# Patient Record
Sex: Female | Born: 1950 | Race: White | Hispanic: No | Smoking: Never smoker
Health system: Southern US, Community
[De-identification: ages and names within clinical notes are randomized; demographics above are authoritative.]

## PROBLEM LIST (undated history)

## (undated) DIAGNOSIS — Z8639 Personal history of other endocrine, nutritional and metabolic disease: Secondary | ICD-10-CM

## (undated) DIAGNOSIS — M1990 Unspecified osteoarthritis, unspecified site: Secondary | ICD-10-CM

## (undated) DIAGNOSIS — H3530 Unspecified macular degeneration: Secondary | ICD-10-CM

## (undated) DIAGNOSIS — D649 Anemia, unspecified: Secondary | ICD-10-CM

## (undated) DIAGNOSIS — R51 Headache: Secondary | ICD-10-CM

## (undated) DIAGNOSIS — K259 Gastric ulcer, unspecified as acute or chronic, without hemorrhage or perforation: Secondary | ICD-10-CM

## (undated) DIAGNOSIS — R519 Headache, unspecified: Secondary | ICD-10-CM

## (undated) DIAGNOSIS — T8859XA Other complications of anesthesia, initial encounter: Secondary | ICD-10-CM

## (undated) DIAGNOSIS — Z8711 Personal history of peptic ulcer disease: Secondary | ICD-10-CM

## (undated) DIAGNOSIS — T4145XA Adverse effect of unspecified anesthetic, initial encounter: Secondary | ICD-10-CM

## (undated) DIAGNOSIS — E871 Hypo-osmolality and hyponatremia: Secondary | ICD-10-CM

## (undated) DIAGNOSIS — E7800 Pure hypercholesterolemia, unspecified: Secondary | ICD-10-CM

## (undated) DIAGNOSIS — M87052 Idiopathic aseptic necrosis of left femur: Secondary | ICD-10-CM

## (undated) DIAGNOSIS — Z8619 Personal history of other infectious and parasitic diseases: Secondary | ICD-10-CM

## (undated) DIAGNOSIS — F419 Anxiety disorder, unspecified: Secondary | ICD-10-CM

## (undated) DIAGNOSIS — Z8719 Personal history of other diseases of the digestive system: Secondary | ICD-10-CM

## (undated) DIAGNOSIS — Z9289 Personal history of other medical treatment: Secondary | ICD-10-CM

## (undated) DIAGNOSIS — I10 Essential (primary) hypertension: Secondary | ICD-10-CM

## (undated) DIAGNOSIS — F329 Major depressive disorder, single episode, unspecified: Secondary | ICD-10-CM

## (undated) DIAGNOSIS — F32A Depression, unspecified: Secondary | ICD-10-CM

## (undated) DIAGNOSIS — K219 Gastro-esophageal reflux disease without esophagitis: Secondary | ICD-10-CM

## (undated) DIAGNOSIS — R569 Unspecified convulsions: Secondary | ICD-10-CM

## (undated) DIAGNOSIS — R748 Abnormal levels of other serum enzymes: Secondary | ICD-10-CM

## (undated) HISTORY — PX: WRIST SURGERY: SHX841

## (undated) HISTORY — PX: TUBAL LIGATION: SHX77

## (undated) HISTORY — PX: STOMACH SURGERY: SHX791

## (undated) HISTORY — PX: EYE SURGERY: SHX253

## (undated) HISTORY — PX: AUGMENTATION MAMMAPLASTY: SUR837

## (undated) HISTORY — PX: CHOLECYSTECTOMY: SHX55

## (undated) HISTORY — PX: WISDOM TOOTH EXTRACTION: SHX21

## (undated) HISTORY — PX: UPPER GI ENDOSCOPY: SHX6162

---

## 1999-08-14 ENCOUNTER — Ambulatory Visit (HOSPITAL_COMMUNITY): Admission: RE | Admit: 1999-08-14 | Discharge: 1999-08-14 | Payer: Self-pay | Admitting: Gastroenterology

## 1999-08-14 ENCOUNTER — Encounter: Payer: Self-pay | Admitting: Gastroenterology

## 1999-08-30 ENCOUNTER — Ambulatory Visit (HOSPITAL_COMMUNITY): Admission: RE | Admit: 1999-08-30 | Discharge: 1999-08-30 | Payer: Self-pay | Admitting: Gastroenterology

## 1999-11-15 ENCOUNTER — Ambulatory Visit (HOSPITAL_COMMUNITY): Admission: RE | Admit: 1999-11-15 | Discharge: 1999-11-15 | Payer: Self-pay | Admitting: Gastroenterology

## 2000-06-20 ENCOUNTER — Encounter: Admission: RE | Admit: 2000-06-20 | Discharge: 2000-06-20 | Payer: Self-pay | Admitting: Obstetrics and Gynecology

## 2000-06-20 ENCOUNTER — Encounter: Payer: Self-pay | Admitting: Obstetrics and Gynecology

## 2001-09-08 ENCOUNTER — Inpatient Hospital Stay (HOSPITAL_COMMUNITY): Admission: EM | Admit: 2001-09-08 | Discharge: 2001-09-11 | Payer: Self-pay | Admitting: *Deleted

## 2001-12-02 ENCOUNTER — Ambulatory Visit (HOSPITAL_COMMUNITY): Admission: RE | Admit: 2001-12-02 | Discharge: 2001-12-02 | Payer: Self-pay | Admitting: Gastroenterology

## 2007-10-22 ENCOUNTER — Emergency Department (HOSPITAL_BASED_OUTPATIENT_CLINIC_OR_DEPARTMENT_OTHER): Admission: EM | Admit: 2007-10-22 | Discharge: 2007-10-22 | Payer: Self-pay | Admitting: Emergency Medicine

## 2008-05-27 ENCOUNTER — Ambulatory Visit: Payer: Self-pay | Admitting: Diagnostic Radiology

## 2008-05-27 ENCOUNTER — Ambulatory Visit (HOSPITAL_BASED_OUTPATIENT_CLINIC_OR_DEPARTMENT_OTHER): Admission: RE | Admit: 2008-05-27 | Discharge: 2008-05-27 | Payer: Self-pay | Admitting: Family Medicine

## 2010-05-20 ENCOUNTER — Encounter: Payer: Self-pay | Admitting: Neurosurgery

## 2010-09-15 NOTE — Procedures (Signed)
[facility]  Patient:    MISHAEL, KRYSIAK                     MRN: [id number] Proc. Date: 08/30/99 Adm. Date:  [id number] Disc. Date: [id number] Attending:  Orland Mustard CC:         Rae Halsted, M.D., [facility], [place], [place].                           Procedure Report  PROCEDURE:  Esophagogastroduodenoscopy.  MEDICATIONS:  Hurricane spray, fentanyl 100 mcg, Versed 10 mg IV.  INDICATION FOR PROCEDURE:  A patient with a previous gastric surgery with gastric- emptying scan showing 100% retention for two hours, continued to do very poorly  living on liquids, unable to eat. This procedure is done to evaluate the stomach anastomosis endoscopically.  DESCRIPTION OF PROCEDURE:  The procedure had been explained to the patient and consent obtained. With the patient in the left lateral decubitus position, the Olympus video endoscope was inserted blindly in the esophagus and advanced under direct visualization. The stomach was entered and there was a large amount of food material. I had to irrigate, change positions and we were eventually able to identify the gastric outlet. I could only enter one limb. The outlet was freely  patent, the scope easily passed. There was an anastomotic gastric ulcer that appeared to be right at the anastomosis of the small bowel. Due to a large amount of retained food material, it was hard to determine if there was a second limb r not. The scope was withdrawn. Again the stomach was found to be full of food material. No other lesions were noted. The distal and proximal esophagus were seen well and were normal.  ASSESSMENT: 1. Anastomotic ulcer with no obvious mechanical outlet obstruction. 2. Severe gastroparesis with marked retained food material despite overnight fast.  PLAN: 1. Will make arrangements for her to see Dr. Epifania Gore back in the office in [facility] for consideration for total  gastrectomy. 2. Will try erythromycin suspension a.c. and will go ahead and treat with Carafate    a.c. and h.s. DD:  08/30/99 TD:  08/31/99 Job: [id number]

## 2010-09-15 NOTE — H&P (Signed)
[place]. [facility]  Patient:    [name] Visit Number: [id number] MRN: [id number]          Service Type: MED Location: [phone] Attending Physician:  [name] Dictated by:   [name]. Randa Evens, M.D. Admit Date:  09/08/2001   CC:         [name], M.D., [facility], [facility]             [address], [place], [place] [address]   History and Physical  DATE OF BIRTH: May 15, 1950  REASON FOR ADMISSION: Acute drop in hemoglobin, possible gastrointestinal bleed.  HISTORY OF PRESENT ILLNESS: This is an unfortunate 60 year old white female, who has a very long history.  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  She has been to see Dr. Ivin Mann and has been over to see Dr. Laurel Mann at [facility], and seen by Dr. Donnie Mann as well.  She has had marginal ulcers and, for reasons that are not entirely clear, her ulcers improved and she did well for a while.  We saw her back in March 2003 and at that time she had been doing better, and had been taking Aciphex and Prilosec, and she had gotten steadily worse, was eating Tums, had been having epigastric pain, had been losing weight, did not want to eat.  The only thing she was eating was yogurt.  We started her back on Carafate and scheduled her for an endoscopy, which she promptly cancelled because she felt bad after the Carafate was started.  She apparently for the past several weeks has been feeling worse again and has seen Dr. Riley Mann in the office.  Her hemoglobin has slowly dropped from 11.0 down to 8.3 today.  She has not seen any blood in her stools and not had any vomiting.  WBC  was normal.  She was having a multitude of symptoms that include not being able to eat.  She is vomiting up medications and liquids and is unable to keep down any of her medicines, and she was sent over to the emergency room with instructions to call me.  CURRENT MEDICATIONS:  1. Carafate 1 g a.c. and h.s.  2. Aciphex or Prilosec q.d.  3. Reglan 10 mg a.c. q.d.  4. Furosemide 20 mg q.d.  5. Topamax 100 mg t.i.d.  6. Zoloft 50 mg q.d.  7. Ery-Tab one t.i.d.  8. Klonopin 1 mg p.r.n.  ALLERGIES:  1. TORADOL.  2. DILANTIN.  3. CODEINE.  PAST MEDICAL HISTORY:  1. 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  2. Seizure disorder following a stroke.  Has been on medications for some     time for this.  3. History of hypertension.  4. History of depression and obsessive-compulsive and panic disorder, as well     as chronic anxiety.  FAMILY HISTORY: Mother died of liver cancer.  Father died of lung cancer. There is depression, hypertension, and alcoholism in the family.  She has three sons who were healthy.  SOCIAL HISTORY: She is  divorced.  Has been working most recently as a Conservation officer, nature. She does not smoke or drink.  She has three sons, two of whom are twins and are graduating from [employer] this weekend.  The other lives in [place] and she is supposed to go over and see him next week.  She is quite upset about not being able to go.  REVIEW OF SYSTEMS: Remarkable primarily for the fact that she has been through menopause, has not had a period in some time.  She has seen in bright red blood in bowel movements nor dark melenic stools.  Her main symptoms seem to do with inability to eat, epigastric pain, and nausea.  PHYSICAL EXAMINATION:  VITAL SIGNS: Temperature 99.4 degrees, blood pressure 120/73.  Pulse 83.  GENERAL: Tearful white female in  no severe distress.  HEENT: Sclerae nonicteric.  EOMI.  NECK: Supple.  No lymphadenopathy.  LUNGS: Clear.  BREAST: Not examined.  She does have large implants.  HEART: Regular rate and rhythm without murmurs or gallops.  ABDOMEN: Nondistended.  A surgical scar is in the upper abdomen.  Bowel sounds are normal.  Very slight epigastric tenderness to examination.  RECTAL: Patient refuses currently.  LABORATORY DATA: Most recently laboratories obtained from Dr. Gorden Mann office revealed a hemoglobin of 8.3, with MCV of 76.  BMET obtained last week revealed a calcium of 11, potassium of 3.2, and sodium of 126.  ASSESSMENT:  1. Nausea, vomiting, abdominal pain, anemia, all probably due to iron     deficiency on the basis of gastrointestinal bleeding due to chronic     ulcers.  2. Depression/anxiety and obsessive-compulsive/panic disorder.  PLAN:  1. Will go ahead and admit.  2. Work-up for anemia with iron studies, folic acid, and B-12, reticulocyte     count.  3. Will guaiac stools.  4. Will plan endoscopy in the morning at 7:30 a.m.  5. Make further recommendations depending on results of that.  6. Will give all of her medicines IV. Dictated by:   [name]. Randa Evens, M.D. Attending Physician:  [name] DD:  09/08/01 TD:  09/09/01 Job: [id number]

## 2010-09-15 NOTE — Procedures (Signed)
[place]. [facility]  Patient:    [name], [name]                     MRN: [id number] Proc. Date: 11/15/99 Adm. Date:  [id number] Attending:  Orland Mustard CC:         Dr. Flora Lipps. [place], Professor of Surgery - [facility] [place], [place] [address]             Dr. Leanord Hawking                           Procedure Report  PROCEDURE:  Esophagogastroduodenoscopy.  ENDOSCOPIST:  Llana Aliment. Edwards, M.D.  MEDICATIONS:  Hurricaine spray, fentanyl 100 mcg, and Versed 15 mg IV.  INDICATIONS:  An unfortunate woman who has had persistent gastroparesis following ______ surgery.  She had an endoscopy a couple months ago with an anastomotic ulcer with severe gastroparesis that took several days of clear liquids to get her stomach cleaned out enough to see.  This procedure is done to evaluate healing of the ulcer and to reevaluate her stomach to see if there has been any improvement.  She has been fasting for 24 hours with only liquids.  DESCRIPTION OF PROCEDURE:  The procedure had been explained to the patient and consent obtained.  With the patient in the left lateral decubitus position, the Olympus video endoscope was inserted blindly into the esophagus and advanced under direct visualization.  The stomach was entered.  Immediately upon entering the stomach, it was seen to be full of solid food material.  We were able to snug the scope along the side and get down into the area of the gastric outlet.  The gastric outlet itself was full of food material where it entered into the apparent gastrojejunostomy area of which the previous ulcer had been on the small bowel side of the gastrojejunostomy that appeared to be essentially healed.  There is one area of slight inflammation.  There is no mechanical blockage other than the presence of the large amount of food material to pass the scope into the small bowel.  Both _____  were filled with apparent solid food.  The scope was withdrawn and other small ulcers could have been missed due to the retained food.  No other lesions of any significance was seen.  The scope was withdrawn and the esophagus was seen well ________ and appeared grossly normal.  The patient tolerated the procedure well, was maintained on low flow oxygen and pulse oximeter throughout the procedure with no obvious problem.  ASSESSMENT:  Essentially normal mucosa, but severe gastroparesis.  The ulcer seen on the previous gastrojejunostomy side of the anastomosis seems to be essentially healed.  I think this continues to be a problem of marked severe delayed gastric emptying.  PLAN: 1. The patient is due to see Dr. Carolynn Sayers in follow up and I have encouraged    her to keep that appointment. 2. We will draw a fasting gastrin level here today as planned. DD:  11/15/99 TD:  11/16/99 Job: [id number]

## 2010-09-15 NOTE — Discharge Summary (Signed)
[place]. [facility]  Patient:    [name], [name] Visit Number: [id number] MRN: [id number]          Service Type: MED Location: [phone] Attending Physician:  Orland Mustard Dictated by:   Llana Aliment. Randa Evens, M.D. Admit Date:  09/08/2001 Discharge Date: 09/11/2001   CC:         [name], M.D.   Discharge Summary  DATE OF BIRTH:  Apr 06, 1951  ADMISSION DIAGNOSES 1. Nausea and vomiting. 2. Abdominal pain. 3. Anemia.  FINAL DIAGNOSES 1. Gastric ulcer with gastroparesis and obstruction. 2. Depression, anxiety and obsessive-compulsive panic disorder. 3. Status post ulcer operation with subsequent redo; exact type of operation    appears to be a gastrojejunostomy. 4. Severe gastroparesis with documented 100% retention on previous gastric    emptying scan. 5. History of seizure disorder following a stroke. 6. History of a stroke. 7. Hypertension.  PERTINENT HISTORY:  The patient with the above chronic medical problems had been seen by me. She has some severe gastroparesis presumably due to vagotomy at the time of her surgeries. She has had recurrent ulcers. They were better and recently she has been having increasing symptoms of nausea and vomiting and epigastric pain; she was only living on yogurt when she came in with these symptoms.  PHYSICAL EXAMINATION  GENERAL:  On physical she was tearful.  VITAL SIGNS:  Temperature 99.4, normal blood pressure.  HEENT, NECK, LUNG:  Examination not remarkable.  ABDOMEN:  Soft with a surgical scar in the upper abdomen.  PERTINENT LABORATORY DATA:  Remarkable for a potassium of 3.2, hemoglobin of 8.3. For more details please see dictated admission history and physical.  HOSPITAL COURSE:  The patient was admitted to the medical floor. She was feeling somewhat better. Her potassium was somewhat low at 3.2. She did have and endoscopy performed the following morning, which showed  gastric outlet obstruction with recurrent ulcer. Her potassium had come back up to 2.2 on admission and required aggressive replacement. Her iron and ferritin were low as well and we gave her a dose of IV iron.  The patient was clinically doing better and she was supposed to go to [place] to visit her son. My recommendation to her was that she postpone the trip to [place], but could go to [place] to see her other son graduate. She was started on a full liquid diet and tolerated this and seemed to be doing better. Extensive counseling about the options of surgery were all discussed with her.  DISPOSITION: The patient was discharged to home on a low residue liquid diet.  DISCHARGE MEDICATIONS 1. Aciphex 20 mg q.d. 2. Carafate q.i.d. 3. Reglan 10 q.i.d. 4. Topamax 1 tablet t.i.d. 5. Zoloft 50 q.h.s. 6. Erytal t.i.d. 7. Klonopin 1 mg q.h.s. p.r.n. 8. K-Dur 20 mEq b.i.d. 9. Zelnorm 6 mg b.i.d.  DISCHARGE INSTRUCTIONS: She will remain on liquids, potatoes, eggs, etc.  FOLLOW UP: She is to see Dr. Randa Evens in the office two days following her discharge for labs and in the office for a visit in two weeks. Dictated by:   Llana Aliment. Randa Evens, M.D. Attending Physician:  Orland Mustard DD:  09/30/01 TD:  10/02/01 Job: [id number]

## 2010-09-15 NOTE — Op Note (Signed)
   [name], [name]                        ACCOUNT NO.:  [id number]   MEDICAL RECORD NO.:  [id number]                   PATIENT TYPE:  AMB   LOCATION:  ENDO                                 FACILITY:  [facility]   PHYSICIAN:  James L. Malon Kindle., M.D.          DATE OF BIRTH:  [birth year]   DATE OF PROCEDURE:  12/02/2001  DATE OF DISCHARGE:                                 OPERATIVE REPORT   PROCEDURE PERFORMED:  Esophagogastroduodenoscopy.   ENDOSCOPIST:  Llana Aliment. Edwards, M.D.   MEDICATIONS:  Xylocaine spray, fentanyl 100 mcg, Versed 8 mg.   INDICATIONS FOR PROCEDURE:  Follow-up for gastric ulcer.  She is a woman who  has had recurrent gastric ulcers despite multiple ulcer operations with  severe documented gastroparesis due to vagal nerve injuries.  She has  actually had to be hospitalized for hypokalemia due to nausea and vomiting.  She was last endoscoped in May.  This is done as a two-month follow-up.   DESCRIPTION OF PROCEDURE:  The procedure had been explained to the patient  and consent obtained.  With the patient in a left lateral decubitus, the  Olympus scope was inserted, advanced under direct visualization.  The  stomach was entered and immediately a large amount of retained food material  that was in the stomach obscuring both the stomach, we irrigated and flushed  and had to turn the patient in multiple positions.  Finally with the patient  in nearly the prone position, we were able to see the ulcer at the gastric  outlet.  It was a large ulcer extended at the gastric outlet.  I was never  able to pass.  Since the ulcer was clearly present, I had a very poor outlet  due to all the retained food materials, I elected not to do biopsies at this  time.  The scope was withdrawn.  The stomach was decompressed as much as  possible.   ASSESSMENT:  Continued gastric ulcer with gastric outlet obstruction.   PLAN:  Will discuss surgical options with the patient.  She has had  previous  arrangements with different surgeons in the area and it will be up to her to  make a determination as to which surgeon she would like to see.                                                James L. Malon Kindle., M.D.    Waldron Session  D:  12/02/2001  T:  12/05/2001  Job:  [id number]   cc:   Pablo Ledger, M.D.

## 2010-09-15 NOTE — Procedures (Signed)
[place]. [facility]  Patient:    [name], [name] Visit Number: [id number] MRN: [id number]          Service Type: MED Location: [phone] Attending Physician:  Orland Mustard Dictated by:   Llana Aliment. Randa Evens, M.D. Proc. Date: 09/09/01 Admit Date:  09/08/2001   CC:         [name], M.D.   Procedure Report  PROCEDURE PERFORMED:  Esophagogastroduodenscopy with biopsies.  ENDOSCOPIST:  Llana Aliment. Randa Evens, M.D.  MEDICATIONS:  Cetacaine spray, fentanyl 75 mcg, Versed 8 mg IV.  INDICATIONS:  Patient with known ulcers, has has recurrent ulcers, had two operations, probably has severe gastroparesis due to vagal nerve injury.  Has had recurrent ulcers, has been somewhat noncompliant with follow-up but at times has done well on therapy.  She came back in with pain, vomiting, anemia and hyperkalemia.  DESCRIPTION OF PROCEDURE:  The procedure had been explained to the patient and consent obtained.  With the patient in the left lateral decubitus position, the Olympus scope was inserted and advanced under direct visualization.  The stomach was entered.  The stomach was full of food.  After snaking around the food, we were able to find the gastric outlet.  It was ulcerated.  I entered it a short distance.  It should be patent to liquids but I was afraid to push the scope any further due to the marked ulceration.  The scope was withdrawn and the mucosa in the stomach that could be seen was normal.  The distal and proximal esophagus was endoscopically normal.  ASSESSMENT: 1. Recurrent ulcerations with gastric outlet obstruction. 2. Marked gastroparesis probably due to gastric outlet obstruction and deep    and delayed gastric emptying due to vagal nerve injury. PLAN:  Will continue on Carafate, liquids, Protonix, Reglan.  Will add Zelnorm. Dictated by:   Llana Aliment. Randa Evens, M.D. Attending Physician:  Orland Mustard DD:  09/09/01 TD:   09/10/01 Job: [id number]

## 2011-01-25 LAB — DIFFERENTIAL
Eosinophils Absolute: 0
Lymphs Abs: 0.9
Monocytes Relative: 5
Neutrophils Relative %: 86 — ABNORMAL HIGH

## 2011-01-25 LAB — BASIC METABOLIC PANEL
BUN: 22
CO2: 25
Calcium: 8.9
Chloride: 98
Creatinine, Ser: 1.2
GFR calc Af Amer: 56 — ABNORMAL LOW
GFR calc non Af Amer: 46 — ABNORMAL LOW
Glucose, Bld: 143 — ABNORMAL HIGH
Potassium: 3.3 — ABNORMAL LOW
Sodium: 134 — ABNORMAL LOW

## 2011-01-25 LAB — CBC
HCT: 40
Hemoglobin: 13.8
MCHC: 34.4
MCV: 91.8
Platelets: 252
RBC: 4.36
RDW: 12.3
WBC: 9.7

## 2011-01-25 LAB — POCT CARDIAC MARKERS: Myoglobin, poc: 88.3

## 2011-04-04 ENCOUNTER — Other Ambulatory Visit: Payer: Self-pay

## 2011-04-04 ENCOUNTER — Encounter: Payer: Self-pay | Admitting: *Deleted

## 2011-04-04 ENCOUNTER — Emergency Department (INDEPENDENT_AMBULATORY_CARE_PROVIDER_SITE_OTHER): Payer: Medicare Other

## 2011-04-04 ENCOUNTER — Emergency Department (HOSPITAL_BASED_OUTPATIENT_CLINIC_OR_DEPARTMENT_OTHER)
Admission: EM | Admit: 2011-04-04 | Discharge: 2011-04-04 | Disposition: A | Discharge status: Home / Self Care | Payer: Medicare Other | Attending: Emergency Medicine | Admitting: Emergency Medicine

## 2011-04-04 DIAGNOSIS — M810 Age-related osteoporosis without current pathological fracture: Secondary | ICD-10-CM | POA: Insufficient documentation

## 2011-04-04 DIAGNOSIS — R0602 Shortness of breath: Secondary | ICD-10-CM

## 2011-04-04 DIAGNOSIS — K219 Gastro-esophageal reflux disease without esophagitis: Secondary | ICD-10-CM | POA: Insufficient documentation

## 2011-04-04 DIAGNOSIS — D649 Anemia, unspecified: Secondary | ICD-10-CM

## 2011-04-04 HISTORY — DX: Essential (primary) hypertension: I10

## 2011-04-04 HISTORY — DX: Anxiety disorder, unspecified: F41.9

## 2011-04-04 HISTORY — DX: Gastro-esophageal reflux disease without esophagitis: K21.9

## 2011-04-04 HISTORY — DX: Unspecified convulsions: R56.9

## 2011-04-04 HISTORY — DX: Gastric ulcer, unspecified as acute or chronic, without hemorrhage or perforation: K25.9

## 2011-04-04 LAB — CBC
HCT: 29.7 % — ABNORMAL LOW (ref 36.0–46.0)
Platelets: 263 10*3/uL (ref 150–400)
RDW: 17.7 % — ABNORMAL HIGH (ref 11.5–15.5)
WBC: 5 10*3/uL (ref 4.0–10.5)

## 2011-04-04 LAB — BASIC METABOLIC PANEL
GFR calc Af Amer: 62 mL/min — ABNORMAL LOW (ref >90)
GFR calc non Af Amer: 53 mL/min — ABNORMAL LOW (ref >90)
Glucose, Bld: 108 mg/dL — ABNORMAL HIGH (ref 70–99)
Potassium: 3.2 mEq/L — ABNORMAL LOW (ref 3.5–5.1)
Sodium: 138 mEq/L (ref 135–145)

## 2011-04-04 LAB — DIFFERENTIAL
Basophils Absolute: 0 10*3/uL (ref 0.0–0.1)
Lymphocytes Relative: 27 % (ref 12–46)
Neutro Abs: 2.6 10*3/uL (ref 1.7–7.7)
Neutrophils Relative %: 53 % (ref 43–77)

## 2011-04-04 LAB — TROPONIN I: Troponin I: <0.3 ng/mL (ref <0.30)

## 2011-04-04 LAB — D-DIMER, QUANTITATIVE: D-Dimer, Quant: 0.31 ug/mL-FEU (ref 0.00–0.48)

## 2011-04-04 NOTE — ED Notes (Signed)
Patient states that she went to [place] for a wedding last week and was having SOB with exertion and it has grown worse over the week states she had to stop and rest when carrying her suitcase,, she states that over the past year she has been SOB after any exercise or movement

## 2011-04-04 NOTE — ED Provider Notes (Signed)
History     CSN: [id number] Arrival date & time: 04/04/2011  9:33 AM   First MD Initiated Contact with Patient 04/04/11 [phone]      Chief Complaint  Patient presents with  . Shortness of Breath    (Consider location/radiation/quality/duration/timing/severity/associated sxs/prior treatment) HPI Patient states that she went to [place] for a wedding last week and was having SOB with exertion and it has grown worse over the week states she had to stop and rest when carrying her suitcase,, she states that over the past year she has been SOB after any exercise or movement Patient states that her symptoms actually began several months ago and she attributed them to lack of exercise.  Patient denies fever, productive cough, arm pain, jaw pain.  Patient denies diaphoresis.  Patient has had some mild chest tightness but does not describe it as discomfort or pain. Past Medical History  Diagnosis Date  . Acid reflux   . Osteoporosis   . Hypertension   . Stomach ulcer   . Seizures   . Anxiety     Past Surgical History  Procedure Date  . Stomach surgery   . Eye surgery     No family history on file.  History  Substance Use Topics  . Smoking status: Never Smoker   . Smokeless tobacco: Not on file  . Alcohol Use: Yes    OB History    Grav Para Term Preterm Abortions TAB SAB Ect Mult Living                  Review of Systems  Allergies  Other and Shellfish allergy  Home Medications   Current Outpatient Rx  Name Route Sig Dispense Refill  . ALPRAZOLAM ER PO Oral Take by mouth.      Marland Kitchen VITAMIN D PO Oral Take by mouth.      . CLONAZEPAM PO Oral Take by mouth.      . CYMBALTA PO Oral Take by mouth.      . OMEGA-3 FATTY ACIDS 1000 MG PO CAPS Oral Take 2 g by mouth daily.      . MECLIZINE HCL PO Oral Take by mouth.      . CENTRUM SILVER PO Oral Take by mouth.      . OMEPRAZOLE 20 MG PO CPDR Oral Take 20 mg by mouth 4 (four) times daily.      Marland Kitchen PRAVASTATIN SODIUM PO Oral Take by  mouth.      . SUCRALFATE PO Oral Take by mouth.      . TOPIRAMATE 100 MG PO TABS Oral Take 100 mg by mouth 4 (four) times daily.        BP 179/79  Pulse 75  Temp(Src) 97.3 F (36.3 C) (Oral)  Resp 16  SpO2 100%  Physical Exam  Nursing note and vitals reviewed. Constitutional: She is oriented to person, place, and time. She appears well-developed and well-nourished. No distress.  HENT:  Head: Normocephalic and atraumatic.  Eyes: Pupils are equal, round, and reactive to light.  Neck: Normal range of motion.  Cardiovascular: Normal rate and intact distal pulses.          Date: 04/04/2011  Rate: 62  Rhythm: normal sinus rhythm  QRS Axis: normal  Intervals: normal  ST/T Wave abnormalities: normal  Conduction Disutrbances:none:   Old EKG Reviewed: none available     Pulmonary/Chest: No respiratory distress.  Abdominal: Normal appearance. She exhibits no distension.  Musculoskeletal: Normal range of motion.  Neurological:  She is alert and oriented to person, place, and time. No cranial nerve deficit.  Skin: Skin is warm and dry. No rash noted.  Psychiatric: She has a normal mood and affect. Her behavior is normal.    ED Course  Procedures (including critical care time) No stool found on rectal exam   Labs Reviewed  BASIC METABOLIC PANEL - Abnormal; Notable for the following:    Potassium 3.2 (*)    Glucose, Bld 108 (*)    GFR calc non Af Amer 53 (*)    GFR calc Af Amer 62 (*)    All other components within normal limits  PRO B NATRIURETIC PEPTIDE - Abnormal; Notable for the following:    BNP, POC 417.2 (*)    All other components within normal limits  CBC - Abnormal; Notable for the following:    RBC 3.80 (*)    Hemoglobin 8.8 (*)    HCT 29.7 (*)    MCH 23.2 (*)    MCHC 29.6 (*)    RDW 17.7 (*)    All other components within normal limits  DIFFERENTIAL - Abnormal; Notable for the following:    Monocytes Relative 14 (*)    Eosinophils Relative 7 (*)    All other  components within normal limits  D-DIMER, QUANTITATIVE  TROPONIN I   Dg Chest 2 View  04/04/2011  *RADIOLOGY REPORT*  Clinical Data: Short of breath  CHEST - 2 VIEW  Comparison: None.  Findings: Lungs are hyperaerated with bronchitic changes and interstitial prominence.    Pneumothorax or pleural effusion.  No consolidation or mass.  Normal heart size.  IMPRESSION: Chronic changes.  No active cardiopulmonary disease.  Original Report Authenticated By: Donavan Burnet, M.D.     1. Anemia       MDM          Nelia Shi, MD 04/04/11 2154

## 2012-09-02 ENCOUNTER — Emergency Department (HOSPITAL_BASED_OUTPATIENT_CLINIC_OR_DEPARTMENT_OTHER): Payer: Medicare Other

## 2012-09-02 ENCOUNTER — Encounter (HOSPITAL_BASED_OUTPATIENT_CLINIC_OR_DEPARTMENT_OTHER): Payer: Self-pay | Admitting: *Deleted

## 2012-09-02 ENCOUNTER — Emergency Department (HOSPITAL_BASED_OUTPATIENT_CLINIC_OR_DEPARTMENT_OTHER)
Admission: EM | Admit: 2012-09-02 | Discharge: 2012-09-03 | Disposition: A | Discharge status: Other Acute Inpatient Hospital | Payer: Medicare Other | Attending: Emergency Medicine | Admitting: Emergency Medicine

## 2012-09-02 DIAGNOSIS — IMO0002 Reserved for concepts with insufficient information to code with codable children: Secondary | ICD-10-CM | POA: Insufficient documentation

## 2012-09-02 DIAGNOSIS — Z79899 Other long term (current) drug therapy: Secondary | ICD-10-CM | POA: Insufficient documentation

## 2012-09-02 DIAGNOSIS — F411 Generalized anxiety disorder: Secondary | ICD-10-CM | POA: Insufficient documentation

## 2012-09-02 DIAGNOSIS — G8929 Other chronic pain: Secondary | ICD-10-CM

## 2012-09-02 DIAGNOSIS — Z9889 Other specified postprocedural states: Secondary | ICD-10-CM | POA: Insufficient documentation

## 2012-09-02 DIAGNOSIS — G8921 Chronic pain due to trauma: Secondary | ICD-10-CM | POA: Insufficient documentation

## 2012-09-02 DIAGNOSIS — Z8719 Personal history of other diseases of the digestive system: Secondary | ICD-10-CM | POA: Insufficient documentation

## 2012-09-02 DIAGNOSIS — M25539 Pain in unspecified wrist: Secondary | ICD-10-CM | POA: Insufficient documentation

## 2012-09-02 DIAGNOSIS — E876 Hypokalemia: Secondary | ICD-10-CM

## 2012-09-02 DIAGNOSIS — G40909 Epilepsy, unspecified, not intractable, without status epilepticus: Secondary | ICD-10-CM

## 2012-09-02 DIAGNOSIS — Y939 Activity, unspecified: Secondary | ICD-10-CM | POA: Insufficient documentation

## 2012-09-02 DIAGNOSIS — M810 Age-related osteoporosis without current pathological fracture: Secondary | ICD-10-CM | POA: Insufficient documentation

## 2012-09-02 DIAGNOSIS — K219 Gastro-esophageal reflux disease without esophagitis: Secondary | ICD-10-CM | POA: Insufficient documentation

## 2012-09-02 DIAGNOSIS — R296 Repeated falls: Secondary | ICD-10-CM | POA: Insufficient documentation

## 2012-09-02 DIAGNOSIS — N289 Disorder of kidney and ureter, unspecified: Secondary | ICD-10-CM

## 2012-09-02 DIAGNOSIS — R4182 Altered mental status, unspecified: Secondary | ICD-10-CM

## 2012-09-02 DIAGNOSIS — E860 Dehydration: Secondary | ICD-10-CM

## 2012-09-02 DIAGNOSIS — R413 Other amnesia: Secondary | ICD-10-CM | POA: Insufficient documentation

## 2012-09-02 DIAGNOSIS — M6282 Rhabdomyolysis: Secondary | ICD-10-CM

## 2012-09-02 DIAGNOSIS — I10 Essential (primary) hypertension: Secondary | ICD-10-CM | POA: Insufficient documentation

## 2012-09-02 DIAGNOSIS — Y92009 Unspecified place in unspecified non-institutional (private) residence as the place of occurrence of the external cause: Secondary | ICD-10-CM | POA: Insufficient documentation

## 2012-09-02 DIAGNOSIS — F29 Unspecified psychosis not due to a substance or known physiological condition: Secondary | ICD-10-CM | POA: Insufficient documentation

## 2012-09-02 LAB — URINE MICROSCOPIC-ADD ON

## 2012-09-02 LAB — COMPREHENSIVE METABOLIC PANEL
ALT: 48 U/L — ABNORMAL HIGH (ref 0–35)
AST: 67 U/L — ABNORMAL HIGH (ref 0–37)
Alkaline Phosphatase: 119 U/L — ABNORMAL HIGH (ref 39–117)
CO2: 23 mEq/L (ref 19–32)
GFR calc Af Amer: 36 mL/min — ABNORMAL LOW (ref >90)
Glucose, Bld: 111 mg/dL — ABNORMAL HIGH (ref 70–99)
Potassium: 2.7 mEq/L — CL (ref 3.5–5.1)
Sodium: 133 mEq/L — ABNORMAL LOW (ref 135–145)
Total Protein: 7.3 g/dL (ref 6.0–8.3)

## 2012-09-02 LAB — URINALYSIS, ROUTINE W REFLEX MICROSCOPIC
Nitrite: NEGATIVE
Specific Gravity, Urine: 1.007 (ref 1.005–1.030)
Urobilinogen, UA: 0.2 mg/dL (ref 0.0–1.0)

## 2012-09-02 LAB — CBC WITH DIFFERENTIAL/PLATELET
Basophils Absolute: 0 10*3/uL (ref 0.0–0.1)
Eosinophils Absolute: 0 10*3/uL (ref 0.0–0.7)
Lymphocytes Relative: 21 % (ref 12–46)
Lymphs Abs: 1.3 10*3/uL (ref 0.7–4.0)
Neutrophils Relative %: 66 % (ref 43–77)
Platelets: 228 10*3/uL (ref 150–400)
RBC: 4.69 MIL/uL (ref 3.87–5.11)
RDW: 13.3 % (ref 11.5–15.5)
WBC: 6.4 10*3/uL (ref 4.0–10.5)

## 2012-09-02 LAB — TROPONIN I: Troponin I: <0.3 ng/mL (ref <0.30)

## 2012-09-02 LAB — CK: Total CK: 1120 U/L — ABNORMAL HIGH (ref 7–177)

## 2012-09-02 LAB — PROTIME-INR: INR: 1.05 (ref 0.00–1.49)

## 2012-09-02 LAB — RAPID URINE DRUG SCREEN, HOSP PERFORMED
Benzodiazepines: NOT DETECTED
Opiates: NOT DETECTED

## 2012-09-02 LAB — LACTIC ACID, PLASMA: Lactic Acid, Venous: 1 mmol/L (ref 0.5–2.2)

## 2012-09-02 LAB — APTT: aPTT: 35 seconds (ref 24–37)

## 2012-09-02 MED ORDER — SODIUM CHLORIDE 0.9 % IV SOLN
INTRAVENOUS | Status: DC
  Administered 2012-09-03: 03:00:00 via INTRAVENOUS

## 2012-09-02 MED ORDER — POTASSIUM CHLORIDE 10 MEQ/100ML IV SOLN
10.0000 meq | Freq: Once | INTRAVENOUS | Status: AC
  Administered 2012-09-02: 10 meq via INTRAVENOUS
  Filled 2012-09-02: qty 100

## 2012-09-02 MED ORDER — SODIUM CHLORIDE 0.9 % IV BOLUS (SEPSIS)
2000.0000 mL | Freq: Once | INTRAVENOUS | Status: AC
  Administered 2012-09-02: 2000 mL via INTRAVENOUS

## 2012-09-02 NOTE — ED Provider Notes (Signed)
History     CSN: [id number]  Arrival date & time 09/02/12  2104   First MD Initiated Contact with Patient 09/02/12 2204      Chief Complaint  Patient presents with  . Leg Pain  . Arm Pain   confusion, weakness  (Consider location/radiation/quality/duration/timing/severity/associated sxs/prior treatment) HPI This 62 year old female lives at home alone, she slipped and fell on the ice in March of this year and broke her right wrist and since then she has had generalized weakness and decreased appetite for the last couple of months, she now presents to the emergency department with 2 days of new onset confusion with amnesia for the events of yesterday, yesterday she she was able to walk to her neighbors house in the morning but that is the last thing she remembers from yesterday, last night she woke up on the floor where she had been lying for an unknown period of time, today she was noted by her neighbors to be somewhat confused and too weak to walk today so she was brought to the emergency department, she is no headache no neck pain no chest pain no cough no shortness of breath no abdominal pain she is no change in her chronic pain to her right wrist, she woke up with some abrasions on her legs, she is a seizure history in her neighbors uncertain as to whether the patient may have had a seizure yesterday or possibly was taking too many pain medications or both, patient was noted to have mildly slurred speech earlier today as well but is no longer has slurred speech tonight her speech is a little bit slow and she is still mildly confused tonight with amnesia for yesterday and was disoriented earlier today but now knows who she is and where she is and knows it is now the month of May but did not know that earlier today she now knows it is 2014 but did not know that earlier today she still does not know the day of the week. She has no back pain or hip pain. There is no treatment prior to arrival. Past  Medical History  Diagnosis Date  . Acid reflux   . Osteoporosis   . Hypertension   . Stomach ulcer   . Seizures   . Anxiety     Past Surgical History  Procedure Laterality Date  . Stomach surgery    . Eye surgery    . Wrist surgery      No family history on file.  History  Substance Use Topics  . Smoking status: Never Smoker   . Smokeless tobacco: Not on file  . Alcohol Use: No    OB History   Grav Para Term Preterm Abortions TAB SAB Ect Mult Living                  Review of Systems 10 Systems reviewed and are negative for acute change except as noted in the HPI. Allergies  Other and Shellfish allergy  Home Medications   Current Outpatient Rx  Name  Route  Sig  Dispense  Refill  . baclofen (LIORESAL) 10 MG tablet   Oral   Take 10 mg by mouth 3 (three) times daily.         . promethazine (PHENERGAN) 25 MG tablet   Oral   Take 25 mg by mouth every 6 (six) hours as needed for nausea.         . traMADol (ULTRAM) 50 MG tablet  Oral   Take 50 mg by mouth every 6 (six) hours as needed for pain.         . traZODone (DESYREL) 50 MG tablet   Oral   Take 50 mg by mouth at bedtime.         . ALPRAZOLAM ER PO   Oral   Take by mouth.           . Cholecalciferol (VITAMIN D PO)   Oral   Take by mouth.           . CLONAZEPAM PO   Oral   Take by mouth.           . DULoxetine HCl (CYMBALTA PO)   Oral   Take by mouth.           . fish oil-omega-3 fatty acids 1000 MG capsule   Oral   Take 2 g by mouth daily.           . MECLIZINE HCL PO   Oral   Take by mouth.           . Multiple Vitamins-Minerals (CENTRUM SILVER PO)   Oral   Take by mouth.           Marland Kitchen omeprazole (PRILOSEC) 20 MG capsule   Oral   Take 20 mg by mouth 4 (four) times daily.           Marland Kitchen PRAVASTATIN SODIUM PO   Oral   Take by mouth.           . SUCRALFATE PO   Oral   Take by mouth.           . topiramate (TOPAMAX) 100 MG tablet   Oral   Take 100  mg by mouth 4 (four) times daily.             BP 182/99  Pulse 92  Temp(Src) 98.6 F (37 C) (Oral)  Resp 20  Wt 90 lb (40.824 kg)  SpO2 100%  Physical Exam  Nursing note and vitals reviewed. Constitutional:  Awake, alert, nontoxic appearance with baseline speech for patient.  HENT:  Head: Atraumatic.  Mouth/Throat: No oropharyngeal exudate.  No tongue biting  Eyes: EOM are normal. Pupils are equal, round, and reactive to light. Right eye exhibits no discharge. Left eye exhibits no discharge.  Neck: Neck supple.  Cervical spine nontender  Cardiovascular: Normal rate and regular rhythm.   No murmur heard. Pulmonary/Chest: Effort normal and breath sounds normal. No stridor. No respiratory distress. She has no wheezes. She has no rales. She exhibits no tenderness.  Abdominal: Soft. Bowel sounds are normal. She exhibits no mass. There is no tenderness. There is no rebound.  Musculoskeletal: She exhibits no tenderness.  Baseline ROM, moves extremities with no obvious new focal weakness. Right wrist in splint.  Lymphadenopathy:    She has no cervical adenopathy.  Neurological: She is alert.  Awake, alert, cooperative oriented to person and place as well as month but not to day of the week; motor strength 4/5 bilaterally; sensation normal to light touch bilaterally; peripheral visual fields full to confrontation; no facial asymmetry; tongue midline; major cranial nerves appear intact; no pronator drift, normal finger to nose bilaterally, too weak to walk  Skin: No rash noted.  Psychiatric: She has a normal mood and affect.    ED Course  Procedures (including critical care time) ECG: NSR, vent rate 89, LVH, normal axis, nonspecific ST abnormality, no significant change noted compared with  [id number]  D/w [facility] Dr. Michae Kava accepted Pt for transfer to [facility] per Pt request, but no bed available until AM. Will continue to rehydrate and replenish potassium overnight  in ED. [facility] also requests 750mg  dose Keppra in ED (will order) as well as magnesium supplementation (will order).  Pt stable in ED with no significant deterioration in condition.Patient / Family / Caregiver informed of clinical course, understand medical decision-making process, and agree with plan. Care endorsed to Dr. Freida Busman in ED at 0030. Labs Reviewed  URINALYSIS, ROUTINE W REFLEX MICROSCOPIC - Abnormal; Notable for the following:    Hgb urine dipstick TRACE (*)    Leukocytes, UA TRACE (*)    All other components within normal limits  CBC WITH DIFFERENTIAL - Abnormal; Notable for the following:    Hemoglobin 15.4 (*)    Monocytes Relative 13 (*)    All other components within normal limits  COMPREHENSIVE METABOLIC PANEL - Abnormal; Notable for the following:    Sodium 133 (*)    Potassium 2.7 (*)    Chloride 91 (*)    Glucose, Bld 111 (*)    BUN 47 (*)    Creatinine, Ser 1.70 (*)    AST 67 (*)    ALT 48 (*)    Alkaline Phosphatase 119 (*)    GFR calc non Af Amer 31 (*)    GFR calc Af Amer 36 (*)    All other components within normal limits  CK - Abnormal; Notable for the following:    Total CK 1120 (*)    All other components within normal limits  TROPONIN I  ETHANOL  PROTIME-INR  APTT  URINE RAPID DRUG SCREEN (HOSP PERFORMED)  LACTIC ACID, PLASMA  URINE MICROSCOPIC-ADD ON   Dg Chest 2 View  09/02/2012  *RADIOLOGY REPORT*  Clinical Data: Weakness and confusion  CHEST - 2 VIEW  Comparison: Chest radiograph 12/05 1012  Findings: Normal cardiac silhouette.  Lungs are hyperinflated.  No effusion, infiltrate, pneumothorax.  No aggressive osseous lesions.  IMPRESSION: Hyperinflated lungs without acute cardiopulmonary findings.   Original Report Authenticated By: Genevive Bi, M.D.    Ct Head Wo Contrast  09/02/2012  *RADIOLOGY REPORT*  Clinical Data: 61 year old female confusion.  Fall.  Weakness. Extremity pain.  CT HEAD WITHOUT CONTRAST  Technique:  Contiguous axial  images were obtained from the base of the skull through the vertex without contrast.  Comparison: 10/22/2007.  Findings: Visualized paranasal sinuses and mastoids are clear. Midline forehead scalp hematoma measuring up to 4 mm in thickness. Frontal sinuses are hypoplastic.  Frontal bones intact.  Scalp and orbit soft tissues elsewhere within normal limits (postoperative changes to the superior globes again noted. No acute osseous abnormality identified.  Calcified atherosclerosis at the skull base.  No ventriculomegaly. Stable cerebral volume. No midline shift, mass effect, or evidence of mass lesion.  Progressed hypodensity of the anterior left corona radiata, may also involve the anterior limb of the left internal capsule and the left caudate.  Elsewhere Stable gray-white matter differentiation throughout the brain.  No evidence of cortically based acute infarction identified.  No acute intracranial hemorrhage identified.  No suspicious intracranial vascular hyperdensity.  IMPRESSION: 1.  Small vessel ischemia of the left white matter capsules new or progressed since 2009. 2.  Forehead scalp soft tissue injury without underlying fracture.   Original Report Authenticated By: Erskine Speed, M.D.      1. Altered mental status   2. Hypokalemia   3. Rhabdomyolysis  4. Dehydration   5. Renal insufficiency   6. Seizure disorder   7. Chronic wrist pain, right       MDM  The patient appears reasonably stabilized for transfer considering the current resources, flow, and capabilities available in the ED at this time, and I doubt any other [facility] requiring further screening and/or treatment in the ED prior to transfer.        Hurman Horn, MD 09/03/12 2136

## 2012-09-02 NOTE — ED Notes (Signed)
Confusion since last night. she woke up in the floor last night and does not remember events that caused her to be in the floor. She has been taking a lot of pain medication for wrist injury in March. She is not eating since the injury. Abrasions to her legs. She has been to weak to walk all day today. C.o sob.

## 2012-09-02 NOTE — ED Notes (Signed)
Critical K+ level of 2.7 called by Marcello Moores in the lab. MD and Clydie Braun RN made aware.

## 2012-09-03 MED ORDER — SODIUM CHLORIDE 0.9 % IV SOLN
750.0000 mg | Freq: Once | INTRAVENOUS | Status: AC
  Administered 2012-09-03: 750 mg via INTRAVENOUS
  Filled 2012-09-03 (×2): qty 5

## 2012-09-03 MED ORDER — MAGNESIUM SULFATE 50 % IJ SOLN
1.0000 g | Freq: Once | INTRAMUSCULAR | Status: AC
  Administered 2012-09-03: 1 g via INTRAVENOUS
  Filled 2012-09-03: qty 2

## 2012-09-03 MED ORDER — POTASSIUM CHLORIDE CRYS ER 20 MEQ PO TBCR
40.0000 meq | EXTENDED_RELEASE_TABLET | Freq: Once | ORAL | Status: AC
  Administered 2012-09-03: 40 meq via ORAL
  Filled 2012-09-03: qty 2

## 2012-09-03 NOTE — ED Notes (Signed)
Received call from nsg supervisor at [facility]. Updated about pt condition. Awaiting bed assignment.

## 2012-09-03 NOTE — ED Notes (Signed)
MD at bedside. 

## 2012-09-03 NOTE — ED Notes (Signed)
Assisted pt on and off of bedside commode. Comfort measures and repositioning done. Pt awaiting admit bed at [facility].

## 2012-09-13 ENCOUNTER — Encounter (HOSPITAL_BASED_OUTPATIENT_CLINIC_OR_DEPARTMENT_OTHER): Payer: Self-pay | Admitting: *Deleted

## 2012-09-13 ENCOUNTER — Emergency Department (HOSPITAL_BASED_OUTPATIENT_CLINIC_OR_DEPARTMENT_OTHER)
Admission: EM | Admit: 2012-09-13 | Discharge: 2012-09-13 | Disposition: A | Discharge status: Home / Self Care | Payer: Medicare Other | Attending: Emergency Medicine | Admitting: Emergency Medicine

## 2012-09-13 ENCOUNTER — Emergency Department (HOSPITAL_BASED_OUTPATIENT_CLINIC_OR_DEPARTMENT_OTHER): Payer: Medicare Other

## 2012-09-13 DIAGNOSIS — K219 Gastro-esophageal reflux disease without esophagitis: Secondary | ICD-10-CM | POA: Insufficient documentation

## 2012-09-13 DIAGNOSIS — M810 Age-related osteoporosis without current pathological fracture: Secondary | ICD-10-CM | POA: Insufficient documentation

## 2012-09-13 DIAGNOSIS — G40909 Epilepsy, unspecified, not intractable, without status epilepticus: Secondary | ICD-10-CM | POA: Insufficient documentation

## 2012-09-13 DIAGNOSIS — I10 Essential (primary) hypertension: Secondary | ICD-10-CM | POA: Insufficient documentation

## 2012-09-13 DIAGNOSIS — F411 Generalized anxiety disorder: Secondary | ICD-10-CM | POA: Insufficient documentation

## 2012-09-13 DIAGNOSIS — Z8711 Personal history of peptic ulcer disease: Secondary | ICD-10-CM | POA: Insufficient documentation

## 2012-09-13 DIAGNOSIS — I160 Hypertensive urgency: Secondary | ICD-10-CM

## 2012-09-13 DIAGNOSIS — R51 Headache: Secondary | ICD-10-CM | POA: Insufficient documentation

## 2012-09-13 DIAGNOSIS — R42 Dizziness and giddiness: Secondary | ICD-10-CM | POA: Insufficient documentation

## 2012-09-13 DIAGNOSIS — Z79899 Other long term (current) drug therapy: Secondary | ICD-10-CM | POA: Insufficient documentation

## 2012-09-13 DIAGNOSIS — H538 Other visual disturbances: Secondary | ICD-10-CM | POA: Insufficient documentation

## 2012-09-13 LAB — CBC WITH DIFFERENTIAL/PLATELET
Eosinophils Relative: 3 % (ref 0–5)
HCT: 41.1 % (ref 36.0–46.0)
Lymphocytes Relative: 13 % (ref 12–46)
Lymphs Abs: 1 10*3/uL (ref 0.7–4.0)
MCV: 97.2 fL (ref 78.0–100.0)
Monocytes Absolute: 0.6 10*3/uL (ref 0.1–1.0)
Platelets: 253 10*3/uL (ref 150–400)
RBC: 4.23 MIL/uL (ref 3.87–5.11)
WBC: 8 10*3/uL (ref 4.0–10.5)

## 2012-09-13 LAB — COMPREHENSIVE METABOLIC PANEL
ALT: 29 U/L (ref 0–35)
CO2: 27 mEq/L (ref 19–32)
Calcium: 9.9 mg/dL (ref 8.4–10.5)
GFR calc Af Amer: 78 mL/min — ABNORMAL LOW (ref >90)
GFR calc non Af Amer: 68 mL/min — ABNORMAL LOW (ref >90)
Glucose, Bld: 124 mg/dL — ABNORMAL HIGH (ref 70–99)
Sodium: 132 mEq/L — ABNORMAL LOW (ref 135–145)
Total Bilirubin: 0.2 mg/dL — ABNORMAL LOW (ref 0.3–1.2)

## 2012-09-13 LAB — URINALYSIS, ROUTINE W REFLEX MICROSCOPIC
Bilirubin Urine: NEGATIVE
Ketones, ur: NEGATIVE mg/dL
Nitrite: NEGATIVE
Protein, ur: NEGATIVE mg/dL
Specific Gravity, Urine: 1.01 (ref 1.005–1.030)
Urobilinogen, UA: 0.2 mg/dL (ref 0.0–1.0)

## 2012-09-13 MED ORDER — TOPIRAMATE 100 MG PO TABS
100.0000 mg | ORAL_TABLET | Freq: Every day | ORAL | Status: DC
  Filled 2012-09-13: qty 1

## 2012-09-13 MED ORDER — HYDRALAZINE HCL 20 MG/ML IJ SOLN
10.0000 mg | INTRAMUSCULAR | Status: AC
  Administered 2012-09-13: 10 mg via INTRAVENOUS
  Filled 2012-09-13: qty 1

## 2012-09-13 NOTE — ED Provider Notes (Signed)
History     CSN: [id number]  Arrival date & time 09/13/12  1052   First MD Initiated Contact with Patient 09/13/12 1123      Chief Complaint  Patient presents with  . Hypertension    (Consider location/radiation/quality/duration/timing/severity/associated sxs/prior treatment) HPI Patient presents with concerns of headache, hypertension. The patient was discharged from another facility 4 days ago after an admission for syncope/altered mental status. She does not believe that there were medication changes made at that time. She states that over the past day she has had persistent headache, with mildly blurred vision.  Symptoms have improved spontaneously. There's been no concurrent vomiting, fall, unilateral weakness, disequilibrium, confusion or disorientation. Given the headache, she saw a primary care physician today, was found to be hypertensive. She is sent here for further evaluation and management.   Past Medical History  Diagnosis Date  . Acid reflux   . Osteoporosis   . Hypertension   . Stomach ulcer   . Seizures   . Anxiety     Past Surgical History  Procedure Laterality Date  . Stomach surgery    . Eye surgery    . Wrist surgery      History reviewed. No pertinent family history.  History  Substance Use Topics  . Smoking status: Never Smoker   . Smokeless tobacco: Not on file  . Alcohol Use: No    OB History   Grav Para Term Preterm Abortions TAB SAB Ect Mult Living                  Review of Systems  Constitutional:       Per HPI, otherwise negative  HENT:       Per HPI, otherwise negative  Respiratory:       Per HPI, otherwise negative  Cardiovascular:       Per HPI, otherwise negative  Gastrointestinal: Negative for vomiting.  Endocrine:       Negative aside from HPI  Genitourinary:       Neg aside from HPI   Musculoskeletal:       Per HPI, otherwise negative  Skin: Negative.   Neurological: Positive for seizures, syncope and  headaches. Negative for dizziness, facial asymmetry, weakness and light-headedness.    Allergies  Other and Shellfish allergy  Home Medications   Current Outpatient Rx  Name  Route  Sig  Dispense  Refill  . amLODipine (NORVASC) 5 MG tablet   Oral   Take 5 mg by mouth daily.         Marland Kitchen losartan (COZAAR) 100 MG tablet   Oral   Take 100 mg by mouth daily.         Marland Kitchen ALPRAZOLAM ER PO   Oral   Take by mouth.           . baclofen (LIORESAL) 10 MG tablet   Oral   Take 10 mg by mouth 3 (three) times daily.         . Cholecalciferol (VITAMIN D PO)   Oral   Take by mouth.           . CLONAZEPAM PO   Oral   Take by mouth.           . DULoxetine HCl (CYMBALTA PO)   Oral   Take by mouth.           . fish oil-omega-3 fatty acids 1000 MG capsule   Oral   Take 2 g by mouth daily.           Marland Kitchen  MECLIZINE HCL PO   Oral   Take by mouth.           . Multiple Vitamins-Minerals (CENTRUM SILVER PO)   Oral   Take by mouth.           Marland Kitchen omeprazole (PRILOSEC) 20 MG capsule   Oral   Take 20 mg by mouth 4 (four) times daily.           Marland Kitchen PRAVASTATIN SODIUM PO   Oral   Take by mouth.           . promethazine (PHENERGAN) 25 MG tablet   Oral   Take 25 mg by mouth every 6 (six) hours as needed for nausea.         . SUCRALFATE PO   Oral   Take by mouth.           . topiramate (TOPAMAX) 100 MG tablet   Oral   Take 100 mg by mouth 4 (four) times daily.           . traMADol (ULTRAM) 50 MG tablet   Oral   Take 50 mg by mouth every 6 (six) hours as needed for pain.         . traZODone (DESYREL) 50 MG tablet   Oral   Take 50 mg by mouth at bedtime.           BP 186/108  Pulse 91  Temp(Src) 98.9 F (37.2 C) (Oral)  Resp 20  SpO2 98%  Physical Exam  Nursing note and vitals reviewed. Constitutional: She is oriented to person, place, and time. She appears well-developed and well-nourished. No distress.  HENT:  Head: Normocephalic and  atraumatic.  Eyes: Conjunctivae and EOM are normal.  Cardiovascular: Normal rate and regular rhythm.   Pulmonary/Chest: Effort normal and breath sounds normal. No stridor. No respiratory distress.  Abdominal: She exhibits no distension.  Musculoskeletal: She exhibits no edema.  Neurological: She is alert and oriented to person, place, and time. She displays tremor. She displays no atrophy. No cranial nerve deficit or sensory deficit. She exhibits normal muscle tone. She displays no seizure activity. Coordination normal.  Persistent lower extremities shaking  Skin: Skin is warm and dry.  Psychiatric: She has a normal mood and affect.    ED Course  Procedures (including critical care time)  Labs Reviewed  CBC WITH DIFFERENTIAL  COMPREHENSIVE METABOLIC PANEL  URINALYSIS, ROUTINE W REFLEX MICROSCOPIC  CK   No results found.   No diagnosis found.   Cardiac: 85sr, normal  Pulse ox 99%ra, normal   Date: 09/13/2012  Rate: 87  Rhythm: normal sinus rhythm  QRS Axis: normal  Intervals: normal  ST/T Wave abnormalities: nonspecific T wave changes  Conduction Disutrbances:none  Narrative Interpretation:   Old EKG Reviewed: unchanged Artefact, but grossly unchanged.  ABNORMAL  1:40 PM Patient appears in no distress. The patient's husband states that she feels and looks much better. Blood pressure is improved approximately 50%.   I obtained the patient's records from recent hospitalization at the other facility. No, MRI demonstrates evidence of old strokes, but no acute findings. EEG demonstrates evidence of prior seizure activity. Labs were largely unremarkable. Neurology consult on within the past 5 days demonstrates concern for medication effects causing her headaches, dizziness, generalized complaints.   MDM  This patient presents with concerns of headache, hypertension, dizziness. Notably, the patient was recently admitted after an episode of seizure versus syncope,  discharged in stable condition. On exam she is  neurologically intact, though she has mild tremulousness of her lower extremities. The patient is awake and alert, oriented appropriately. She is afebrile, there's no suspicion for meningitis. The patient's blood pressure improved following additional antihypertensive. Her review of patient's chart, both from here and from the other facility demonstrates concern for polypharmacy, which will require additional close outpatient followup and medication titration. On repeat exam the patient is significantly better, calm, with improved blood pressure. Thus, she was appropriate for discharge with close outpatient followup    Gerhard Munch, MD 09/13/12 1343

## 2012-09-13 NOTE — ED Notes (Signed)
Pt reports that she has had a HA, blurred vision and HTN since yesterday.  pts medication was changed yesterday by PCP-has taken one dose of medication.  Pt woke up this morning with a worse HA.  Reports hx of HTN.

## 2012-09-13 NOTE — ED Notes (Signed)
Faxed signed release form to [facility] medical records for information.

## 2012-09-14 ENCOUNTER — Observation Stay (HOSPITAL_COMMUNITY)
Admission: EM | Admit: 2012-09-14 | Discharge: 2012-09-17 | Disposition: A | Discharge status: Skilled Nursing Facility | Payer: Medicare Other | Attending: Internal Medicine | Admitting: Internal Medicine

## 2012-09-14 ENCOUNTER — Observation Stay (HOSPITAL_COMMUNITY): Payer: Medicare Other

## 2012-09-14 ENCOUNTER — Emergency Department (HOSPITAL_COMMUNITY): Payer: Medicare Other

## 2012-09-14 ENCOUNTER — Encounter (HOSPITAL_COMMUNITY): Payer: Self-pay | Admitting: Emergency Medicine

## 2012-09-14 DIAGNOSIS — F411 Generalized anxiety disorder: Secondary | ICD-10-CM

## 2012-09-14 DIAGNOSIS — R51 Headache: Secondary | ICD-10-CM | POA: Insufficient documentation

## 2012-09-14 DIAGNOSIS — R5381 Other malaise: Secondary | ICD-10-CM | POA: Insufficient documentation

## 2012-09-14 DIAGNOSIS — F419 Anxiety disorder, unspecified: Secondary | ICD-10-CM

## 2012-09-14 DIAGNOSIS — Z681 Body mass index (BMI) 19 or less, adult: Secondary | ICD-10-CM | POA: Insufficient documentation

## 2012-09-14 DIAGNOSIS — E876 Hypokalemia: Secondary | ICD-10-CM | POA: Insufficient documentation

## 2012-09-14 DIAGNOSIS — Z8673 Personal history of transient ischemic attack (TIA), and cerebral infarction without residual deficits: Secondary | ICD-10-CM | POA: Insufficient documentation

## 2012-09-14 DIAGNOSIS — I10 Essential (primary) hypertension: Principal | ICD-10-CM | POA: Insufficient documentation

## 2012-09-14 DIAGNOSIS — K219 Gastro-esophageal reflux disease without esophagitis: Secondary | ICD-10-CM | POA: Insufficient documentation

## 2012-09-14 DIAGNOSIS — E41 Nutritional marasmus: Secondary | ICD-10-CM | POA: Insufficient documentation

## 2012-09-14 DIAGNOSIS — Z79899 Other long term (current) drug therapy: Secondary | ICD-10-CM | POA: Insufficient documentation

## 2012-09-14 DIAGNOSIS — I160 Hypertensive urgency: Secondary | ICD-10-CM

## 2012-09-14 DIAGNOSIS — R569 Unspecified convulsions: Secondary | ICD-10-CM | POA: Diagnosis present

## 2012-09-14 DIAGNOSIS — G40909 Epilepsy, unspecified, not intractable, without status epilepticus: Secondary | ICD-10-CM | POA: Insufficient documentation

## 2012-09-14 DIAGNOSIS — R262 Difficulty in walking, not elsewhere classified: Secondary | ICD-10-CM | POA: Insufficient documentation

## 2012-09-14 DIAGNOSIS — H538 Other visual disturbances: Secondary | ICD-10-CM | POA: Insufficient documentation

## 2012-09-14 DIAGNOSIS — M810 Age-related osteoporosis without current pathological fracture: Secondary | ICD-10-CM | POA: Insufficient documentation

## 2012-09-14 DIAGNOSIS — N289 Disorder of kidney and ureter, unspecified: Secondary | ICD-10-CM | POA: Insufficient documentation

## 2012-09-14 LAB — URINALYSIS, ROUTINE W REFLEX MICROSCOPIC
Bilirubin Urine: NEGATIVE
Hgb urine dipstick: NEGATIVE
Nitrite: NEGATIVE
Specific Gravity, Urine: 1.008 (ref 1.005–1.030)
Urobilinogen, UA: 0.2 mg/dL (ref 0.0–1.0)
pH: 8 (ref 5.0–8.0)

## 2012-09-14 LAB — POCT I-STAT TROPONIN I: Troponin i, poc: 0.04 ng/mL (ref 0.00–0.08)

## 2012-09-14 LAB — COMPREHENSIVE METABOLIC PANEL
ALT: 24 U/L (ref 0–35)
AST: 23 U/L (ref 0–37)
Calcium: 9 mg/dL (ref 8.4–10.5)
Creatinine, Ser: 0.91 mg/dL (ref 0.50–1.10)
GFR calc Af Amer: 77 mL/min — ABNORMAL LOW (ref >90)
Sodium: 132 mEq/L — ABNORMAL LOW (ref 135–145)
Total Protein: 6.3 g/dL (ref 6.0–8.3)

## 2012-09-14 LAB — CBC WITH DIFFERENTIAL/PLATELET
Basophils Absolute: 0 10*3/uL (ref 0.0–0.1)
Eosinophils Absolute: 0.3 10*3/uL (ref 0.0–0.7)
Eosinophils Relative: 4 % (ref 0–5)
MCH: 32.4 pg (ref 26.0–34.0)
MCHC: 34.3 g/dL (ref 30.0–36.0)
MCV: 94.4 fL (ref 78.0–100.0)
Monocytes Absolute: 0.6 10*3/uL (ref 0.1–1.0)
Platelets: 242 10*3/uL (ref 150–400)
RDW: 13.1 % (ref 11.5–15.5)

## 2012-09-14 MED ORDER — LABETALOL HCL 5 MG/ML IV SOLN
20.0000 mg | Freq: Once | INTRAVENOUS | Status: AC
  Administered 2012-09-14: 20 mg via INTRAVENOUS
  Filled 2012-09-14: qty 4

## 2012-09-14 MED ORDER — ONDANSETRON HCL 4 MG PO TABS: 4.0000 mg | ORAL_TABLET | Freq: Four times a day (QID) | ORAL | Status: DC | PRN

## 2012-09-14 MED ORDER — LOSARTAN POTASSIUM 50 MG PO TABS: 100.0000 mg | ORAL_TABLET | Freq: Every day | ORAL | Status: DC

## 2012-09-14 MED ORDER — SODIUM CHLORIDE 0.9 % IJ SOLN
3.0000 mL | Freq: Two times a day (BID) | INTRAMUSCULAR | Status: DC
  Administered 2012-09-14 – 2012-09-16 (×4): 3 mL via INTRAVENOUS

## 2012-09-14 MED ORDER — TOPIRAMATE 100 MG PO TABS
100.0000 mg | ORAL_TABLET | Freq: Every day | ORAL | Status: DC
  Administered 2012-09-14 – 2012-09-17 (×14): 100 mg via ORAL
  Filled 2012-09-14 (×14): qty 1
  Filled 2012-09-14: qty 4
  Filled 2012-09-14 (×3): qty 1

## 2012-09-14 MED ORDER — LORAZEPAM 2 MG/ML IJ SOLN
1.0000 mg | Freq: Once | INTRAMUSCULAR | Status: AC
  Administered 2012-09-14: 1 mg via INTRAVENOUS
  Filled 2012-09-14: qty 1

## 2012-09-14 MED ORDER — HYDRALAZINE HCL 20 MG/ML IJ SOLN
5.0000 mg | Freq: Four times a day (QID) | INTRAMUSCULAR | Status: DC | PRN
  Administered 2012-09-14 – 2012-09-17 (×2): 5 mg via INTRAVENOUS
  Filled 2012-09-14 (×3): qty 1

## 2012-09-14 MED ORDER — PANTOPRAZOLE SODIUM 40 MG PO TBEC
40.0000 mg | DELAYED_RELEASE_TABLET | Freq: Every day | ORAL | Status: DC
  Administered 2012-09-14 – 2012-09-17 (×4): 40 mg via ORAL
  Filled 2012-09-14 (×4): qty 1

## 2012-09-14 MED ORDER — POTASSIUM CHLORIDE CRYS ER 20 MEQ PO TBCR
40.0000 meq | EXTENDED_RELEASE_TABLET | Freq: Four times a day (QID) | ORAL | Status: AC
  Administered 2012-09-14 – 2012-09-15 (×2): 40 meq via ORAL
  Filled 2012-09-14: qty 2

## 2012-09-14 MED ORDER — ACETAMINOPHEN 650 MG RE SUPP: 650.0000 mg | Freq: Four times a day (QID) | RECTAL | Status: DC | PRN

## 2012-09-14 MED ORDER — HEPARIN SODIUM (PORCINE) 5000 UNIT/ML IJ SOLN
5000.0000 [IU] | Freq: Three times a day (TID) | INTRAMUSCULAR | Status: DC
  Administered 2012-09-15 – 2012-09-17 (×7): 5000 [IU] via SUBCUTANEOUS
  Filled 2012-09-14 (×10): qty 1

## 2012-09-14 MED ORDER — ONDANSETRON HCL 4 MG/2ML IJ SOLN: 4.0000 mg | Freq: Four times a day (QID) | INTRAMUSCULAR | Status: DC | PRN

## 2012-09-14 MED ORDER — LOSARTAN POTASSIUM 50 MG PO TABS
100.0000 mg | ORAL_TABLET | Freq: Every day | ORAL | Status: DC
  Administered 2012-09-14: 100 mg via ORAL
  Filled 2012-09-14 (×2): qty 2

## 2012-09-14 MED ORDER — HYDROCHLOROTHIAZIDE 25 MG PO TABS
25.0000 mg | ORAL_TABLET | Freq: Every day | ORAL | Status: DC
  Administered 2012-09-14: 25 mg via ORAL
  Filled 2012-09-14 (×2): qty 1

## 2012-09-14 MED ORDER — ACETAMINOPHEN 325 MG PO TABS
650.0000 mg | ORAL_TABLET | Freq: Four times a day (QID) | ORAL | Status: DC | PRN
  Administered 2012-09-15 – 2012-09-17 (×7): 650 mg via ORAL
  Filled 2012-09-14 (×8): qty 2

## 2012-09-14 MED ORDER — AMLODIPINE BESYLATE 10 MG PO TABS
10.0000 mg | ORAL_TABLET | Freq: Every day | ORAL | Status: DC
  Administered 2012-09-14: 10 mg via ORAL
  Filled 2012-09-14 (×2): qty 1

## 2012-09-14 MED ORDER — SODIUM CHLORIDE 0.9 % IV SOLN: Freq: Once | INTRAVENOUS | Status: DC

## 2012-09-14 NOTE — ED Notes (Signed)
Per EMS: Pt was seen yesterday at [facility] for same sx. Htn, weakness. Takes htn meds at bedtime. Therefore, has not had meds today. Hx anxiety disorder.

## 2012-09-14 NOTE — ED Notes (Signed)
Per [facility] - CT to be done tonight, MRI tomorrow.

## 2012-09-14 NOTE — ED Notes (Signed)
Pt to CT

## 2012-09-14 NOTE — H&P (Signed)
[facility] History and Physical  [name] WJX:[id number] DOB: 20-Mar-1951 DOA: 09/14/2012  Referring physician: Carleene Cooper III, MD PCP: Angelica Chessman., MD  Specialists: None  Chief Complaint: Elevated blood pressure  HPI: [name] is a 62 y.o. female with past medical history of hypertension, anxiety and seizure disorder. Patient came in to the hospital because of elevated blood pressure. Patient said for the past 2 days she wasn't feeling but, yesterday she had blurred vision, some headaches and she felt overall slow, so she went to [facility] med center, she was found to have blood pressure of 218/110, blood pressure was controlled with her oral medications plus IV hydralazine and she was sent home. Patient had the same symptoms recurred again today with blurry vision, difficulty with walking, and she didn't know what exactly she lost her consciousness or not. Patient called a friend and the friend called EMS and they brought her to the hospital for further evaluation. She was found to have blood pressure of 205/95. Initial evaluation showed anxious patient, herself is at bedside he was very worried about polypharmacy.  Review of Systems:  Constitutional: negative for anorexia, fevers and sweats Eyes: negative for irritation, redness and visual disturbance Ears, nose, mouth, throat, and face: negative for earaches, epistaxis, nasal congestion and sore throat Respiratory: negative for cough, dyspnea on exertion, sputum and wheezing Cardiovascular: negative for chest pain, dyspnea, lower extremity edema, orthopnea, palpitations and syncope Gastrointestinal: negative for abdominal pain, constipation, diarrhea, melena, nausea and vomiting Genitourinary:negative for dysuria, frequency and hematuria Hematologic/lymphatic: negative for bleeding, easy bruising and lymphadenopathy Musculoskeletal:negative for arthralgias, muscle weakness and stiff joints Neurological: Per  history of present illness Endocrine: negative for diabetic symptoms including polydipsia, polyuria and weight loss Allergic/Immunologic: negative for anaphylaxis, hay fever and urticaria   Past Medical History  Diagnosis Date  . Acid reflux   . Osteoporosis   . Hypertension   . Stomach ulcer   . Seizures   . Anxiety    Past Surgical History  Procedure Laterality Date  . Stomach surgery    . Eye surgery    . Wrist surgery     Social History:  reports that she has never smoked. She does not have any smokeless tobacco history on file. She reports that she does not drink alcohol or use illicit drugs. Lives alone at home.  Allergies  Allergen Reactions  . Codeine Swelling  . Other Hives    Seizure meds, dilantin  . Shellfish Allergy   . Toradol (Ketorolac Tromethamine)     hullications    No family history on file.   Prior to Admission medications   Medication Sig Start Date End Date Taking? Authorizing Provider  amLODipine (NORVASC) 5 MG tablet Take 5 mg by mouth daily.   Yes Historical Provider, MD  baclofen (LIORESAL) 10 MG tablet Take 10 mg by mouth 3 (three) times daily.   Yes Historical Provider, MD  Cholecalciferol (VITAMIN D PO) Take by mouth.     Yes Historical Provider, MD  clonazePAM (KLONOPIN) 0.25 MG disintegrating tablet Take 0.125 mg by mouth at bedtime.   Yes Historical Provider, MD  Coenzyme Q10 (COQ10) 100 MG CAPS Take 1 capsule by mouth daily.   Yes Historical Provider, MD  losartan (COZAAR) 100 MG tablet Take 100 mg by mouth daily.   Yes Historical Provider, MD  MAGNESIUM PO Take 1 tablet by mouth daily.   Yes Historical Provider, MD  MECLIZINE HCL PO Take by mouth.  Yes Historical Provider, MD  Multiple Vitamin (MULTI-VITAMIN PO) Take 1 tablet by mouth daily.   Yes Historical Provider, MD  omeprazole (PRILOSEC) 20 MG capsule Take 20 mg by mouth daily.    Yes Historical Provider, MD  oxymetazoline (NASAL SPRAY 12 HOUR) 0.05 % nasal spray Place 1 spray  into both nostrils daily as needed for congestion.   Yes Historical Provider, MD  Potassium (POTASSIMIN PO) Take 1 tablet by mouth daily.   Yes Historical Provider, MD  PRAVASTATIN SODIUM PO Take by mouth.     Yes Historical Provider, MD  prochlorperazine (COMPAZINE) 10 MG tablet Take 10 mg by mouth every 6 (six) hours as needed (nausa).   Yes Historical Provider, MD  promethazine (PHENERGAN) 25 MG tablet Take 25 mg by mouth every 6 (six) hours as needed for nausea.   Yes Historical Provider, MD  senna-docusate (SENOKOT-S) 8.6-50 MG per tablet Take 2 tablets by mouth daily as needed for constipation.   Yes Historical Provider, MD  topiramate (TOPAMAX) 100 MG tablet Take 100 mg by mouth 5 (five) times daily.    Yes Historical Provider, MD  traMADol (ULTRAM) 50 MG tablet Take 50 mg by mouth every 6 (six) hours as needed for pain. No longer taking   Yes Historical Provider, MD  traZODone (DESYREL) 50 MG tablet Take 50 mg by mouth at bedtime.   Yes Historical Provider, MD   Physical Exam: Filed Vitals:   09/14/12 1730 09/14/12 1800 09/14/12 1815 09/14/12 1830  BP: 196/93 144/92 191/102 176/107  Pulse: 85 77 73 72  Temp:      TempSrc:      Resp: 15 17 14 17   SpO2: 99% 98% 99% 98%   General appearance: alert, cooperative and no distress, anxious and pressured speech  Head: Normocephalic, without obvious abnormality, atraumatic  Eyes: conjunctivae/corneas clear. PERRL, EOM's intact. Fundi benign.  Nose: Nares normal. Septum midline. Mucosa normal. No drainage or sinus tenderness.  Throat: lips, mucosa, and tongue normal; teeth and gums normal  Neck: Supple, no masses, no cervical lymphadenopathy, no JVD appreciated, no meningeal signs Resp: clear to auscultation bilaterally  Chest wall: no tenderness  Cardio: regular rate and rhythm, S1, S2 normal, no murmur, click, rub or gallop  GI: soft, non-tender; bowel sounds normal; no masses, no organomegaly  Extremities: extremities normal,  atraumatic, no cyanosis or edema  Skin: Skin color, texture, turgor normal. No rashes or lesions  Neurologic: Alert and oriented X 3, normal strength and tone. Normal symmetric reflexes. Normal coordination and gait  Labs on Admission:  Basic Metabolic Panel:  Recent Labs Lab 09/13/12 1200 09/14/12 1549  NA 132* 132*  K 3.9 3.4*  CL 92* 98  CO2 27 24  GLUCOSE 124* 123*  BUN 24* 20  CREATININE 0.90 0.91  CALCIUM 9.9 9.0   Liver Function Tests:  Recent Labs Lab 09/13/12 1200 09/14/12 1549  AST 31 23  ALT 29 24  ALKPHOS 87 74  BILITOT 0.2* 0.2*  PROT 6.9 6.3  ALBUMIN 3.7 3.5   No results found for this basename: LIPASE, AMYLASE,  in the last 168 hours No results found for this basename: AMMONIA,  in the last 168 hours CBC:  Recent Labs Lab 09/13/12 1200 09/14/12 1549  WBC 8.0 6.3  NEUTROABS 6.1 4.2  HGB 13.8 13.3  HCT 41.1 38.8  MCV 97.2 94.4  PLT 253 242   Cardiac Enzymes:  Recent Labs Lab 09/13/12 1200  CKTOTAL 121    BNP (last 3  results) No results found for this basename: PROBNP,  in the last 8760 hours CBG: No results found for this basename: GLUCAP,  in the last 168 hours  Radiological Exams on Admission: Dg Chest 2 View  09/14/2012   *RADIOLOGY REPORT*  Clinical Data: Hypertension and syncope.  CHEST - 2 VIEW  Comparison: 09/13/2012  Findings: Hyperinflation.  Moderate osteopenia.  Midline trachea.  Normal heart size.  Age advanced aortic atherosclerosis. No pleural effusion or pneumothorax.  Mild peribronchial thickening.  Breast implants. No lobar consolidation.  IMPRESSION: Hyperinflation/COPD. No acute superimposed process.   Original Report Authenticated By: Jeronimo Greaves, M.D.   Dg Chest 2 View  09/13/2012   *RADIOLOGY REPORT*  Clinical Data: Anxiety.  Hypertension.  CHEST - 2 VIEW  Comparison: 09/02/2012  Findings: Lungs remain hyperaerated without new nodule or consolidation.  Normal heart size.  Overlying leads cause artifact over the upper  lung zones.  No pneumothorax and no pleural effusion.  IMPRESSION: No active cardiopulmonary disease.  Changes related to COPD are stable.   Original Report Authenticated By: Jolaine Click, M.D.    EKG: Independently reviewed.   Assessment/Plan Principal Problem:   Hypertensive urgency Active Problems:   Polypharmacy   Anxiety   GERD (gastroesophageal reflux disease)   Seizure   Hypertensive urgency -Patient reports the symptoms of blurry vision, difficulty with gait and syncope-like episode. -Will check MRI of the brain, place patient on telemetry monitoring. She does not have any neuro deficits now. -She denies any recent use of OTC drugs or herbal supplements. -Restarted her medications, increase amlodipine to 10 and added HCTZ. -IV hydralazine for elevated blood pressure.  Hypokalemia Replace with oral supplements. Check BMP in a.m.  Polypharmacy -Son was at bedside the time of the interview, he was very worried that his mother taking too much medications. -He said the medication reported to the pharmacy it's not current. -He will try to bring complete list of her medication tonight, pharmacy to review and list as all medications.  GERD -Place on Protonix.  Seizure disorder -Her symptoms could be seizure but unlikely, it is more consistent with hypertensive urgency symptoms. -Patient is on Topamax continue home medications. I will hold on the EEG now.  Code Status: *Full code Family Communication: *Plan discussed with the patient with the presence of her son at bedside. Disposition Plan: Observation, telemetry  Time spent: 70 minutes  [facility] Pager [phone]  If 7PM-7AM, please contact night-coverage [email or web address] Password [email or web address] 09/14/2012, 7:01 PM

## 2012-09-14 NOTE — ED Notes (Signed)
Pt states she needs an anti-anxiety med before she has her MRI in the AM.

## 2012-09-14 NOTE — ED Provider Notes (Signed)
History     CSN: [id number]  Arrival date & time 09/14/12  1427   First MD Initiated Contact with Patient 09/14/12 1449      Chief Complaint  Patient presents with  . Hypertension  . Fatigue    (Consider location/radiation/quality/duration/timing/severity/associated sxs/prior treatment) Patient is a 62 y.o. female presenting with hypertension. The history is provided by the patient and medical records. No language interpreter was used.  Hypertension This is a recurrent (Pt is a 62 yo woman who says that her health has been going downhill since she suffered a fracture of her wrist in March 2014.) problem. The current episode started 1 to 2 hours ago (She has had very high blood pressure, has felt weak, and has had several fainting spells.  She says that she fainted today.  She describes feeling weak, lying on the floor, and losing consciousness.). The problem occurs every several days (She called her girlfriend, who called EMS, and pt was transported to [facility] ED by EMS.). The problem has not changed since onset.Pertinent negatives include no chest pain, no abdominal pain, no headaches and no shortness of breath. Nothing aggravates the symptoms. Nothing relieves the symptoms. Treatments tried: She had prior inpatient workup at [facility], on 5/6, and was seen by her primary care physician, Pollyann Glen, M.D., who increased her medications for hypertension, and by Gerhard Munch, M.D. yesterday at [facility] ED.  The treatment provided no relief.    Past Medical History  Diagnosis Date  . Acid reflux   . Osteoporosis   . Hypertension   . Stomach ulcer   . Seizures   . Anxiety     Past Surgical History  Procedure Laterality Date  . Stomach surgery    . Eye surgery    . Wrist surgery      No family history on file.  History  Substance Use Topics  . Smoking status: Never Smoker   . Smokeless tobacco: Not on file  . Alcohol Use: No    OB  History   Grav Para Term Preterm Abortions TAB SAB Ect Mult Living                  Review of Systems  Constitutional:       She feels weak.  She feels cold, but has not had a fever.  HENT: Negative.   Eyes: Visual disturbance: Blurred vision at times.  Respiratory: Negative.  Negative for shortness of breath.   Cardiovascular: Positive for palpitations (She feels her heart beating rapidly at times.). Negative for chest pain.  Gastrointestinal: Negative for abdominal pain. Diarrhea: Occasional diarrhea.  Genitourinary: Negative.   Musculoskeletal: Negative.   Skin: Negative.   Neurological: Positive for weakness. Negative for numbness and headaches.  Psychiatric/Behavioral: Negative.     Allergies  Other and Shellfish allergy  Home Medications   Current Outpatient Rx  Name  Route  Sig  Dispense  Refill  . ALPRAZOLAM ER PO   Oral   Take by mouth.           Marland Kitchen amLODipine (NORVASC) 5 MG tablet   Oral   Take 5 mg by mouth daily.         . baclofen (LIORESAL) 10 MG tablet   Oral   Take 10 mg by mouth 3 (three) times daily.         . Cholecalciferol (VITAMIN D PO)   Oral   Take by mouth.           Marland Kitchen  CLONAZEPAM PO   Oral   Take by mouth.           . DULoxetine HCl (CYMBALTA PO)   Oral   Take by mouth.           . fish oil-omega-3 fatty acids 1000 MG capsule   Oral   Take 2 g by mouth daily.           Marland Kitchen losartan (COZAAR) 100 MG tablet   Oral   Take 100 mg by mouth daily.         . MECLIZINE HCL PO   Oral   Take by mouth.           . Multiple Vitamins-Minerals (CENTRUM SILVER PO)   Oral   Take by mouth.           Marland Kitchen omeprazole (PRILOSEC) 20 MG capsule   Oral   Take 20 mg by mouth 4 (four) times daily.           Marland Kitchen PRAVASTATIN SODIUM PO   Oral   Take by mouth.           . promethazine (PHENERGAN) 25 MG tablet   Oral   Take 25 mg by mouth every 6 (six) hours as needed for nausea.         . SUCRALFATE PO   Oral   Take by  mouth.           . topiramate (TOPAMAX) 100 MG tablet   Oral   Take 100 mg by mouth 4 (four) times daily.           . traMADol (ULTRAM) 50 MG tablet   Oral   Take 50 mg by mouth every 6 (six) hours as needed for pain.         . traZODone (DESYREL) 50 MG tablet   Oral   Take 50 mg by mouth at bedtime.           BP 205/95  Temp(Src) 98.2 F (36.8 C) (Oral)  Resp 12  SpO2 100%  Physical Exam  Nursing note and vitals reviewed. Constitutional: She is oriented to person, place, and time.  BP 205/95.  Slender late middle aged woman in no distress at rest.  HENT:  Head: Normocephalic and atraumatic.  Right Ear: External ear normal.  Left Ear: External ear normal.  Mouth/Throat: Oropharynx is clear and moist.  Eyes: Conjunctivae and EOM are normal. Pupils are equal, round, and reactive to light. No scleral icterus.  Neck: Normal range of motion. Neck supple.  Cardiovascular: Normal rate, regular rhythm and normal heart sounds.   Pulmonary/Chest: Effort normal and breath sounds normal. She has no wheezes. She has no rales.  Abdominal: Soft. Bowel sounds are normal.  Musculoskeletal: Normal range of motion. She exhibits no edema and no tenderness.  Neurological: She is alert and oriented to person, place, and time.  No sensory or motor deficit.  Skin: Skin is warm and dry.  Psychiatric: She has a normal mood and affect. Her behavior is normal.    ED Course  Procedures (including critical care time)  Labs Reviewed - No data to display Dg Chest 2 View  09/13/2012   *RADIOLOGY REPORT*  Clinical Data: Anxiety.  Hypertension.  CHEST - 2 VIEW  Comparison: 09/02/2012  Findings: Lungs remain hyperaerated without new nodule or consolidation.  Normal heart size.  Overlying leads cause artifact over the upper lung zones.  No pneumothorax and no pleural effusion.  IMPRESSION: No  active cardiopulmonary disease.  Changes related to COPD are stable.   Original Report Authenticated By:  Jolaine Click, M.D.   2:54 PM  Date: 09/14/2012  Rate: 85  Rhythm: normal sinus rhythm  QRS Axis: right  Intervals: QT prolonged QRS:  Poor R wave progression in precordial leads suggests possible old anterior myocardial infarction.  ST/T Wave abnormalities: normal  Conduction Disutrbances:none  Narrative Interpretation: Borderline EKG  Old EKG Reviewed: none available  3:26 PM Pt was seen and had physical examination.  IV labetalol was ordered for her hypertension.  Lab workup ordered.  Old charts from [facility] ordered.  5:12 PM Continues hypertensive.  Repeat IV labetalol 20 mg.  Ativan 1 mg IV for anxiety.  Results for orders placed during the hospital encounter of 09/14/12  CBC WITH DIFFERENTIAL      Result Value Range   WBC 6.3  4.0 - 10.5 K/uL   RBC 4.11  3.87 - 5.11 MIL/uL   Hemoglobin 13.3  12.0 - 15.0 g/dL   HCT 16.1  09.6 - 04.5 %   MCV 94.4  78.0 - 100.0 fL   MCH 32.4  26.0 - 34.0 pg   MCHC 34.3  30.0 - 36.0 g/dL   RDW 40.9  81.1 - 91.4 %   Platelets 242  150 - 400 K/uL   Neutrophils Relative % 67  43 - 77 %   Neutro Abs 4.2  1.7 - 7.7 K/uL   Lymphocytes Relative 20  12 - 46 %   Lymphs Abs 1.3  0.7 - 4.0 K/uL   Monocytes Relative 9  3 - 12 %   Monocytes Absolute 0.6  0.1 - 1.0 K/uL   Eosinophils Relative 4  0 - 5 %   Eosinophils Absolute 0.3  0.0 - 0.7 K/uL   Basophils Relative 0  0 - 1 %   Basophils Absolute 0.0  0.0 - 0.1 K/uL  COMPREHENSIVE METABOLIC PANEL      Result Value Range   Sodium 132 (*) 135 - 145 mEq/L   Potassium 3.4 (*) 3.5 - 5.1 mEq/L   Chloride 98  96 - 112 mEq/L   CO2 24  19 - 32 mEq/L   Glucose, Bld 123 (*) 70 - 99 mg/dL   BUN 20  6 - 23 mg/dL   Creatinine, Ser 7.82  0.50 - 1.10 mg/dL   Calcium 9.0  8.4 - 95.6 mg/dL   Total Protein 6.3  6.0 - 8.3 g/dL   Albumin 3.5  3.5 - 5.2 g/dL   AST 23  0 - 37 U/L   ALT 24  0 - 35 U/L   Alkaline Phosphatase 74  39 - 117 U/L   Total Bilirubin 0.2 (*) 0.3 - 1.2 mg/dL   GFR  calc non Af Amer 67 (*) >90 mL/min   GFR calc Af Amer 77 (*) >90 mL/min  URINALYSIS, ROUTINE W REFLEX MICROSCOPIC      Result Value Range   Color, Urine YELLOW  YELLOW   APPearance CLEAR  CLEAR   Specific Gravity, Urine 1.008  1.005 - 1.030   pH 8.0  5.0 - 8.0   Glucose, UA NEGATIVE  NEGATIVE mg/dL   Hgb urine dipstick NEGATIVE  NEGATIVE   Bilirubin Urine NEGATIVE  NEGATIVE   Ketones, ur NEGATIVE  NEGATIVE mg/dL   Protein, ur NEGATIVE  NEGATIVE mg/dL   Urobilinogen, UA 0.2  0.0 - 1.0 mg/dL   Nitrite NEGATIVE  NEGATIVE   Leukocytes, UA  NEGATIVE  NEGATIVE  POCT I-STAT TROPONIN I      Result Value Range   Troponin i, poc 0.04  0.00 - 0.08 ng/mL   Comment 3            Dg Chest 2 View  09/14/2012   *RADIOLOGY REPORT*  Clinical Data: Hypertension and syncope.  CHEST - 2 VIEW  Comparison: 09/13/2012  Findings: Hyperinflation.  Moderate osteopenia.  Midline trachea.  Normal heart size.  Age advanced aortic atherosclerosis. No pleural effusion or pneumothorax.  Mild peribronchial thickening.  Breast implants. No lobar consolidation.  IMPRESSION: Hyperinflation/COPD. No acute superimposed process.   Original Report Authenticated By: Jeronimo Greaves, M.D.        6:37 PM Continued hypertension despite 2 doses of IV Labetalol.  Lab workup benign.  Will request observation admission.  1. Hypertensive urgency   2. Anxiety         Carleene Cooper III, MD 09/14/12 [phone]

## 2012-09-15 ENCOUNTER — Observation Stay (HOSPITAL_COMMUNITY): Payer: Medicare Other

## 2012-09-15 LAB — CBC
HCT: 36.7 % (ref 36.0–46.0)
Hemoglobin: 12.4 g/dL (ref 12.0–15.0)
MCH: 32.2 pg (ref 26.0–34.0)
MCHC: 33.8 g/dL (ref 30.0–36.0)
RDW: 13.5 % (ref 11.5–15.5)

## 2012-09-15 LAB — BASIC METABOLIC PANEL
BUN: 24 mg/dL — ABNORMAL HIGH (ref 6–23)
Creatinine, Ser: 0.98 mg/dL (ref 0.50–1.10)
GFR calc Af Amer: 71 mL/min — ABNORMAL LOW (ref >90)
GFR calc non Af Amer: 61 mL/min — ABNORMAL LOW (ref >90)
Glucose, Bld: 111 mg/dL — ABNORMAL HIGH (ref 70–99)
Potassium: 3.3 mEq/L — ABNORMAL LOW (ref 3.5–5.1)

## 2012-09-15 LAB — TROPONIN I: Troponin I: <0.3 ng/mL (ref <0.30)

## 2012-09-15 MED ORDER — ENSURE COMPLETE PO LIQD: 237.0000 mL | Freq: Three times a day (TID) | ORAL | Status: DC

## 2012-09-15 MED ORDER — SUCRALFATE 1 G PO TABS
1.0000 g | ORAL_TABLET | Freq: Four times a day (QID) | ORAL | Status: DC
  Administered 2012-09-15 – 2012-09-17 (×8): 1 g via ORAL
  Filled 2012-09-15 (×10): qty 1

## 2012-09-15 MED ORDER — CLONAZEPAM 0.5 MG PO TABS
0.2500 mg | ORAL_TABLET | Freq: Once | ORAL | Status: AC
  Administered 2012-09-15: 0.25 mg via ORAL
  Filled 2012-09-15: qty 1

## 2012-09-15 MED ORDER — CLONAZEPAM 0.125 MG PO TBDP
0.1250 mg | ORAL_TABLET | Freq: Every day | ORAL | Status: DC
  Filled 2012-09-15: qty 1

## 2012-09-15 MED ORDER — CLONAZEPAM 0.5 MG PO TABS: 0.2500 mg | ORAL_TABLET | Freq: Every day | ORAL | Status: DC

## 2012-09-15 MED ORDER — BACLOFEN 5 MG HALF TABLET
5.0000 mg | ORAL_TABLET | Freq: Two times a day (BID) | ORAL | Status: DC
  Administered 2012-09-15 – 2012-09-17 (×5): 5 mg via ORAL
  Filled 2012-09-15 (×6): qty 1

## 2012-09-15 MED ORDER — LORAZEPAM 2 MG/ML IJ SOLN
1.0000 mg | Freq: Once | INTRAMUSCULAR | Status: AC
  Administered 2012-09-15: 1 mg via INTRAVENOUS
  Filled 2012-09-15: qty 1

## 2012-09-15 MED ORDER — POTASSIUM CHLORIDE CRYS ER 20 MEQ PO TBCR
40.0000 meq | EXTENDED_RELEASE_TABLET | Freq: Two times a day (BID) | ORAL | Status: DC
  Administered 2012-09-15 – 2012-09-17 (×5): 40 meq via ORAL
  Filled 2012-09-15 (×6): qty 2

## 2012-09-15 MED ORDER — HYDROCHLOROTHIAZIDE 25 MG PO TABS
25.0000 mg | ORAL_TABLET | Freq: Every day | ORAL | Status: DC
  Administered 2012-09-16 – 2012-09-17 (×2): 25 mg via ORAL
  Filled 2012-09-15 (×2): qty 1

## 2012-09-15 MED ORDER — ASPIRIN 81 MG PO CHEW
81.0000 mg | CHEWABLE_TABLET | Freq: Every day | ORAL | Status: DC
  Administered 2012-09-15 – 2012-09-17 (×3): 81 mg via ORAL
  Filled 2012-09-15 (×3): qty 1

## 2012-09-15 MED ORDER — AMLODIPINE BESYLATE 5 MG PO TABS
5.0000 mg | ORAL_TABLET | Freq: Every day | ORAL | Status: DC
  Administered 2012-09-16: 5 mg via ORAL
  Filled 2012-09-15 (×2): qty 1

## 2012-09-15 MED ORDER — SODIUM CHLORIDE 0.9 % IV BOLUS (SEPSIS): 500.0000 mL | Freq: Once | INTRAVENOUS | Status: DC

## 2012-09-15 MED ORDER — BACLOFEN 5 MG HALF TABLET: 5.0000 mg | ORAL_TABLET | Freq: Three times a day (TID) | ORAL | Status: DC

## 2012-09-15 NOTE — Progress Notes (Signed)
[facility] PROGRESS NOTE  [name]:[id number] DOB: March 11, 1951 DOA: 09/14/2012 PCP: Angelica Chessman., MD  Assessment/Plan: 1- Hypertensive urgency: Patient received 40 mg of IV labetalol. Patient was started on higher dose Norvasc. HCTZ started 5-18. Her BP decrease to 90, she was dizzy. I will change norvasc to her home dose. Continue with HCTZ. Patient will benefit from H-H nurse or rehab if possible.  2-Hypokalemia: replete with 40 meq BID. Check Mg level.  3-Seizure disorder: Continue with Topamax  4-Headache, Blurry vision, Weakness: probably related to HTN urgency. MRI negative for acute stroke. Patient is aware that she had prior stroke. She relates that she had a work up. I will start Aspirin daily.  5-Severe Malnutrition: Ensure TIA.  6-Polypharmacy: will ask pharmacist to do med rec.    Code Status: Full. Family Communication: care discussed with patient.  Disposition Plan: to be determine. Will order PT, OT.    Consultants:  none  Procedures: MRI: No acute infarct. Remote left corona radiata infarct with small area of involvement of the superior lateral left thalamus and superior medial left  lenticular nucleus. Small vessel disease type changes.   Antibiotics:  None  HPI/Subjective: Patient was dizzi this morning, at that time her BP was soft in high 90.   Objective: Filed Vitals:   09/14/12 2210 09/15/12 0111 09/15/12 0511 09/15/12 1207  BP: 134/74 95/54 97/61  125/75  Pulse: 74 73 80 75  Temp: 98.5 F (36.9 C)  98.6 F (37 C)   TempSrc: Oral  Oral   Resp: 18  18   Height: 5\' 3"  (1.6 m)     Weight: 40.824 kg (90 lb)     SpO2: 98%  97%     Intake/Output Summary (Last 24 hours) at 09/15/12 1309 Last data filed at 09/15/12 0730  Gross per 24 hour  Intake    240 ml  Output    700 ml  Net   -460 ml   Filed Weights   09/14/12 2210  Weight: 40.824 kg (90 lb)    Exam:   General:  No distress.   Cardiovascular: S 1, S 2  RRR  Respiratory: CTA  Abdomen: Bs present, soft, NT  Musculoskeletal: no edema.   Data Reviewed: Basic Metabolic Panel:  Recent Labs Lab 09/13/12 1200 09/14/12 1549 09/15/12 0130  NA 132* 132* 133*  K 3.9 3.4* 3.3*  CL 92* 98 98  CO2 27 24 24   GLUCOSE 124* 123* 111*  BUN 24* 20 24*  CREATININE 0.90 0.91 0.98  CALCIUM 9.9 9.0 9.0   Liver Function Tests:  Recent Labs Lab 09/13/12 1200 09/14/12 1549  AST 31 23  ALT 29 24  ALKPHOS 87 74  BILITOT 0.2* 0.2*  PROT 6.9 6.3  ALBUMIN 3.7 3.5   No results found for this basename: LIPASE, AMYLASE,  in the last 168 hours No results found for this basename: AMMONIA,  in the last 168 hours CBC:  Recent Labs Lab 09/13/12 1200 09/14/12 1549 09/15/12 0130  WBC 8.0 6.3 6.4  NEUTROABS 6.1 4.2  --   HGB 13.8 13.3 12.4  HCT 41.1 38.8 36.7  MCV 97.2 94.4 95.3  PLT 253 242 230   Cardiac Enzymes:  Recent Labs Lab 09/13/12 1200 09/14/12 1944 09/15/12 0126 09/15/12 0759  CKTOTAL 121  --   --   --   TROPONINI  --  <0.30 <0.30 <0.30   BNP (last 3 results) No results found for this basename: PROBNP,  in the last  8760 hours CBG: No results found for this basename: GLUCAP,  in the last 168 hours  No results found for this or any previous visit (from the past 240 hour(s)).   Studies: Dg Chest 2 View  09/14/2012   *RADIOLOGY REPORT*  Clinical Data: Hypertension and syncope.  CHEST - 2 VIEW  Comparison: 09/13/2012  Findings: Hyperinflation.  Moderate osteopenia.  Midline trachea.  Normal heart size.  Age advanced aortic atherosclerosis. No pleural effusion or pneumothorax.  Mild peribronchial thickening.  Breast implants. No lobar consolidation.  IMPRESSION: Hyperinflation/COPD. No acute superimposed process.   Original Report Authenticated By: Jeronimo Greaves, M.D.   Ct Head Wo Contrast  09/14/2012   *RADIOLOGY REPORT*  Clinical Data: Blurred vision.  CT HEAD WITHOUT CONTRAST  Technique:  Contiguous axial images were obtained  from the base of the skull through the vertex without contrast.  Comparison: CT scan dated 09/02/2012  Findings: There is no acute intracranial hemorrhage, infarction, or mass lesion.  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  Slight periventricular white matter lucency on the left consistent with chronic small vessel ischemic disease.  No acute osseous abnormality.  Evidence of previous surgery in the posterior superior aspects of each eye.  IMPRESSION: No acute intracranial abnormality.   Original Report Authenticated By: Francene Boyers, M.D.   Mr Brain Wo Contrast  09/15/2012   *RADIOLOGY REPORT*  Clinical Data: Accelerated hypertension.  Memory loss.  Headaches. Rule out infarct.  MRI HEAD WITHOUT CONTRAST  Technique:  Multiplanar, multiecho pulse sequences of the brain and surrounding structures were obtained according to standard protocol without intravenous contrast.  Comparison: 09/14/2012 CT.  No comparison MR.  Findings: Motion degraded exam.  No acute infarct.  Remote left corona radiata infarct with small area of involvement of the superior lateral left thalamus and superior medial left lenticular nucleus.  Small vessel disease type changes.  No intracranial hemorrhage.  No intracranial mass lesion detected on this unenhanced exam.  Major intracranial vascular structures are patent with diminutive size right vertebral artery which may be congenitally small.  Cervical medullary junction, pituitary region, pineal region and orbital structures unremarkable.  IMPRESSION: Motion degraded exam.  No acute infarct.  Remote left corona radiata infarct with small area of involvement of the superior lateral left thalamus and superior medial left lenticular nucleus.  Small vessel disease type changes.   Original Report Authenticated By: Lacy Duverney, M.D.    Scheduled Meds: . [START ON 09/16/2012] amLODipine  5 mg Oral Daily  . aspirin  81 mg Oral Daily  . baclofen  5 mg Oral BID  . heparin  5,000 Units  Subcutaneous Q8H  . [START ON 09/16/2012] hydrochlorothiazide  25 mg Oral Daily  . pantoprazole  40 mg Oral Daily  . sodium chloride  500 mL Intravenous Once  . sodium chloride  3 mL Intravenous Q12H  . topiramate  100 mg Oral 5 X Daily   Continuous Infusions:   Principal Problem:   Hypertensive urgency Active Problems:   Polypharmacy   Anxiety   GERD (gastroesophageal reflux disease)   Seizure    Time spent: 35 minutes    Raidon Swanner  [facility] Pager [phone]. If 7PM-7AM, please contact night-coverage at [email or web address], password [facility] 09/15/2012, 1:09 PM  LOS: 1 day

## 2012-09-15 NOTE — Evaluation (Addendum)
Physical Therapy Evaluation Patient Details Name: [name] MRN: [id number] DOB: 16-Sep-1950 Today's Date: 09/15/2012 Time: 4098-1191 PT Time Calculation (min): 27 min  PT Assessment / Plan / Recommendation Clinical Impression  62 yo female admitted hypertension and episodes of syncope at home.  Pt is anxious about falling, but is able to perform bed mobility independently and ambulation with +1 hand hold assist. Recommend HHPT, aide at home Pt may also benefit from life alert button for reassurance she will have help if she needs it.    PT Assessment       Follow Up Recommendations  Home health PT (home health aide)    Does the patient have the potential to tolerate intense rehabilitation      Barriers to Discharge        Equipment Recommendations  None recommended by PT    Recommendations for Other Services     Frequency      Precautions / Restrictions Precautions Precautions: Fall Restrictions Weight Bearing Restrictions: No   Pertinent Vitals/Pain No c/o pain, but pt describes some restless feelings in legs and feeling dizzy when up      Mobility  Bed Mobility Bed Mobility: Rolling Right;Rolling Left Rolling Right: 7: Independent Rolling Left: 7: Independent Transfers Transfers: Sit to Stand;Stand to Sit Sit to Stand: 6: Modified independent (Device/Increase time) Stand to Sit: 6: Modified independent (Device/Increase time) Details for Transfer Assistance: extra time, pt c/o dizziness when standing Ambulation/Gait Ambulation/Gait Assistance: 4: Min assist Ambulation Distance (Feet): 100 Feet Assistive device: 1 person hand held assist    Exercises     PT Diagnosis:    PT Problem List:   PT Treatment Interventions:     PT Goals Acute Rehab PT Goals PT Goal Formulation: With patient Time For Goal Achievement: 09/29/12 Potential to Achieve Goals: Good Pt will go Sit to Stand: Independently PT Goal: Sit to Stand - Progress: Goal set today Pt  will go Stand to Sit: Independently PT Goal: Stand to Sit - Progress: Goal set today Pt will Ambulate: >150 feet;Independently;with least restrictive assistive device PT Goal: Ambulate - Progress: Goal set today Pt will Go Up / Down Stairs: 1-2 stairs;Independently PT Goal: Up/Down Stairs - Progress: Goal set today  Visit Information  Last PT Received On: 09/15/12    Subjective Data  Subjective: Pt reports she has been too afraid that she will fall at home Patient Stated Goal: to get better   Prior Functioning  Home Living Lives With: Alone Available Help at Discharge: Friend(s) Type of Home: House Home Access: Stairs to enter [employer] of Steps: 1 Entrance Stairs-Rails: None Home Layout: One level Home Adaptive Equipment: Walker - rolling Prior Function Level of Independence: Independent Able to Take Stairs?: Yes Comments: pt reports she has been having problems with passing out recently Communication Communication: No difficulties    Cognition  Cognition Arousal/Alertness: Awake/alert Behavior During Therapy: WFL for tasks assessed/performed Overall Cognitive Status: Within Functional Limits for tasks assessed    Extremity/Trunk Assessment Right Lower Extremity Assessment RLE ROM/Strength/Tone: Within functional levels RLE Sensation: WFL - Light Touch;WFL - Proprioception RLE Coordination: WFL - gross/fine motor Left Lower Extremity Assessment LLE ROM/Strength/Tone: Within functional levels LLE Sensation: WFL - Light Touch;WFL - Proprioception LLE Coordination: WFL - gross/fine motor Trunk Assessment Trunk Assessment: Other exceptions Trunk Exceptions: pt very thin   Balance Balance Balance Assessed: Yes Static Sitting Balance Static Sitting - Balance Support: No upper extremity supported Static Sitting - Level of Assistance: 7: Independent  Static Standing Balance Static Standing - Balance Support: No upper extremity supported Static Standing -  Level of Assistance: 5: Stand by assistance  End of Session PT - End of Session Activity Tolerance: Patient tolerated treatment well Patient left: in bed Nurse Communication: Mobility status  GP Functional Assessment Tool Used: clinical judgement Functional Limitation: Mobility: Walking and moving around Mobility: Walking and Moving Around Current Status (A5409): At least 1 percent but less than 20 percent impaired, limited or restricted Mobility: Walking and Moving Around Goal Status [phone]): 0 percent impaired, limited or restricted   Donnetta Hail 09/15/2012, 3:58 PM

## 2012-09-15 NOTE — Progress Notes (Signed)
INITIAL NUTRITION ASSESSMENT  DOCUMENTATION CODES Per approved criteria  -Severe malnutrition in the context of chronic illness -Underweight   INTERVENTION:  Snacks TID (8 am, 2 pm and 8 pm) RD to follow for nutrition care plan  NUTRITION DIAGNOSIS: Inadequate oral intake related to poor appetite as evidenced by patient report  Goal: Oral intake with meals & snacks to meet >/= 90% of estimated nutrition needs  Monitor:  PO & snack intake, weight, labs, I/O's  Reason for Assessment: Malnutrition Screening Tool Report  62 y.o. female  Admitting Dx: Hypertensive urgency  ASSESSMENT: Patient with PMH of hypertension, anxiety and seizure disorder; came in to the hospital because of elevated blood pressure found to have BP of 205/95.   Patient reports her appetite has been poor; reports she has dumping syndrome and tries to eat 6 small meals per day; cannot consume any nutritional supplements given sugar content; reports a 15 lb weight loss in the past 3 months; patient with visible subcutaneous fat loss (triceps, biceps) & muscle loss (temples, shoulders, clavicles).  Patient meets criteria for severe malnutrition in the context of chronic illness given < 75% intake of estimated energy requirement for > 1 month, 14% weight loss and severe muscle & subcutaneous fat loss.  Height: Ht Readings from Last 1 Encounters:  09/14/12 5\' 3"  (1.6 m)    Weight: Wt Readings from Last 1 Encounters:  09/14/12 90 lb (40.824 kg)    Ideal Body Weight: 115 lb  % Ideal Body Weight: 78%  Wt Readings from Last 10 Encounters:  09/14/12 90 lb (40.824 kg)  09/02/12 90 lb (40.824 kg)    Usual Body Weight: ---  % Usual Body Weight: ---  BMI:  Body mass index is 15.95 kg/(m^2).  Estimated Nutritional Needs: Kcal: 1200-1400 Protein: 60-70 gm Fluid: >/= 1.5 L  Skin: Intact  Diet Order: Cardiac  EDUCATION NEEDS: -No education needs identified at this time   Intake/Output Summary  (Last 24 hours) at 09/15/12 1208 Last data filed at 09/15/12 0730  Gross per 24 hour  Intake    240 ml  Output    700 ml  Net   -460 ml    Last BM: 5/18  Labs:   Recent Labs Lab 09/13/12 1200 09/14/12 1549 09/15/12 0130  NA 132* 132* 133*  K 3.9 3.4* 3.3*  CL 92* 98 98  CO2 27 24 24   BUN 24* 20 24*  CREATININE 0.90 0.91 0.98  CALCIUM 9.9 9.0 9.0  GLUCOSE 124* 123* 111*    Scheduled Meds: . [START ON 09/16/2012] amLODipine  5 mg Oral Daily  . aspirin  81 mg Oral Daily  . baclofen  5 mg Oral BID  . heparin  5,000 Units Subcutaneous Q8H  . [START ON 09/16/2012] hydrochlorothiazide  25 mg Oral Daily  . pantoprazole  40 mg Oral Daily  . sodium chloride  500 mL Intravenous Once  . sodium chloride  3 mL Intravenous Q12H  . topiramate  100 mg Oral 5 X Daily    Continuous Infusions:   Past Medical History  Diagnosis Date  . Acid reflux   . Osteoporosis   . Hypertension   . Stomach ulcer   . Seizures   . Anxiety     Past Surgical History  Procedure Laterality Date  . Stomach surgery    . Eye surgery    . Wrist surgery      Maureen Chatters, RD, LDN Pager #: [phone] After-Hours Pager #: [phone]

## 2012-09-15 NOTE — Progress Notes (Signed)
Pt ambulated 150 ft with minimal assistance. Tolerated well. Dion Saucier

## 2012-09-15 NOTE — Progress Notes (Signed)
Clinical Social Work Department BRIEF PSYCHOSOCIAL ASSESSMENT 09/15/2012  Patient:  [name], [name]     Account Number:  [id number]     Admit date:  09/14/2012  Clinical Social Worker:  Orpah Greek  Date/Time:  09/15/2012 03:16 PM  Referred by:  Physician  Date Referred:  09/15/2012 Referred for  SNF Placement   Other Referral:   Interview type:  Family Other interview type:    PSYCHOSOCIAL DATA Living Status:  ALONE Admitted from facility:   Level of care:   Primary support name:  Susy Frizzle (oldest son) ph#: [phone] Primary support relationship to patient:  CHILD, ADULT Degree of support available:   good    CURRENT CONCERNS Current Concerns  Post-Acute Placement   Other Concerns:    SOCIAL WORK ASSESSMENT / PLAN CSW spoke with patient's son, Susy Frizzle via phone re: discharge planning. Son states that he spoke with his mom about going to a facility as she has been in and out of the hospital multiple times recently and feels that she would benefit from going to a facility before returning home.   Assessment/plan status:  Information/Referral to [facility] Other assessment/ plan:   Information/referral to community resources:   CSW completed FL2 and faxed information out to [facility]. CSW will follow-up with bed offers when available.    PATIENT'S/FAMILY'S RESPONSE TO PLAN OF CARE: Patient/son are agreeable to SNF search - anticipating discharge tomorrow.       Unice Bailey, LCSW [facility] Clinical Social Worker cell #: [phone]

## 2012-09-15 NOTE — Progress Notes (Signed)
Pt with c/o's dizziness and nausea.  BP 95/54.  HR 73.  Instructed not to get up OOB.

## 2012-09-15 NOTE — Progress Notes (Addendum)
Clinical Social Work Department CLINICAL SOCIAL WORK PLACEMENT NOTE 09/15/2012  Patient:  [name], [name]  Account Number:  [id number] Admit date:  09/14/2012  Clinical Social Worker:  Orpah Greek  Date/time:  09/15/2012 03:31 PM  Clinical Social Work is seeking post-discharge placement for this patient at the following level of care:   SKILLED NURSING   (*CSW will update this form in Epic as items are completed)   09/15/2012  Patient/family provided with [facility] Department of Clinical Social Work's list of facilities offering this level of care within the geographic area requested by the patient (or if unable, by the patient's family).  09/15/2012  Patient/family informed of their freedom to choose among providers that offer the needed level of care, that participate in Medicare, Medicaid or managed care program needed by the patient, have an available bed and are willing to accept the patient.  09/15/2012  Patient/family informed of [facility]' ownership interest in [facility], as well as of the fact that they are under no obligation to receive care at this facility.  PASARR submitted to [employer] on 09/15/2012 PASARR number received from [employer] on 09/15/2012  FL2 transmitted to all facilities in geographic area requested by pt/family on  09/15/2012 FL2 transmitted to all facilities within larger geographic area on   Patient informed that his/her managed care company has contracts with or will negotiate with  certain facilities, including the following:     Patient/family informed of bed offers received:  09-15-12 Patient chooses bed at [facility] Physician recommends and patient chooses bed at    Patient to be transferred to [facility] on 09-17-12   Patient to be transferred to facility by Private vehicle of family  The following physician request were entered in Epic:   Additional Comments: Unice Bailey, LCSW [facility] Clinical Social Worker cell #: [phone]

## 2012-09-16 DIAGNOSIS — F411 Generalized anxiety disorder: Secondary | ICD-10-CM

## 2012-09-16 DIAGNOSIS — R569 Unspecified convulsions: Secondary | ICD-10-CM

## 2012-09-16 DIAGNOSIS — Z79899 Other long term (current) drug therapy: Secondary | ICD-10-CM

## 2012-09-16 LAB — BASIC METABOLIC PANEL
BUN: 27 mg/dL — ABNORMAL HIGH (ref 6–23)
CO2: 26 mEq/L (ref 19–32)
Calcium: 9.5 mg/dL (ref 8.4–10.5)
Chloride: 101 mEq/L (ref 96–112)
Creatinine, Ser: 1.11 mg/dL — ABNORMAL HIGH (ref 0.50–1.10)

## 2012-09-16 MED ORDER — HYDROCODONE-ACETAMINOPHEN 5-325 MG PO TABS
1.0000 | ORAL_TABLET | Freq: Four times a day (QID) | ORAL | Status: DC | PRN
  Administered 2012-09-16: 1 via ORAL
  Filled 2012-09-16: qty 1

## 2012-09-16 MED ORDER — CLONAZEPAM 0.5 MG PO TABS
0.2500 mg | ORAL_TABLET | Freq: Two times a day (BID) | ORAL | Status: DC | PRN
  Administered 2012-09-16 – 2012-09-17 (×3): 0.25 mg via ORAL
  Filled 2012-09-16 (×2): qty 2
  Filled 2012-09-16: qty 1

## 2012-09-16 MED ORDER — SODIUM CHLORIDE 0.9 % IV SOLN
INTRAVENOUS | Status: DC
  Administered 2012-09-16: 17:00:00 via INTRAVENOUS

## 2012-09-16 NOTE — Progress Notes (Signed)
Wasted 1/2 tablet of clonipine into sharps container with witness Eartha Inch, RN to give pt prescribed dosage of 0.25mg . Tablets come in 0.5mg .

## 2012-09-16 NOTE — Progress Notes (Signed)
[facility] PROGRESS NOTE  [name] ZOX:[id number] DOB: [birth year] DOA: 09/14/2012 PCP: Angelica Chessman., MD  Assessment/Plan: 1- Hypertensive urgency: Patient received 40 mg of IV labetalol. Patient was started on higher dose Norvasc. HCTZ started 5-18. Her BP decrease to 90, she was dizzy. change norvasc to her home dose. Continue with HCTZ. Repeat b-met in am to follow renal function. BP improved.  2-Hypokalemia: resolved. Mg normal.  3-Seizure disorder: Continue with Topamax  4-Headache, Blurry vision, Weakness: probably related to HTN urgency. MRI negative for acute stroke. Patient is aware that she had prior stroke. She relates that she had a work up.  Aspirin daily. Will order Vicodin for headaches. Patient take klonopin twice a day.  5-Severe Malnutrition: Ensure TIA.  6-Polypharmacy: will ask pharmacist to do med rec.  7-Renal insufficiency: Start IV fluids. Repeat bmet in am. Cozaar discontinue.    Code Status: Full. Family Communication: care discussed with patient.  Disposition Plan: PT recommending SNF. Bed options was given to patient son. SNF when bed available.    Consultants:  none  Procedures: MRI: No acute infarct. Remote left corona radiata infarct with small area of involvement of the superior lateral left thalamus and superior medial left  lenticular nucleus. Small vessel disease type changes.   Antibiotics:  None  HPI/Subjective: Still with mild headaches. She take klonopin twice a day. She has history of anxiety. She feel weak.   Objective: Filed Vitals:   09/16/12 0516 09/16/12 0934 09/16/12 0935 09/16/12 1308  BP: 121/83 144/97 126/91 126/78  Pulse: 85   88  Temp: 98.2 F (36.8 C)   98.1 F (36.7 C)  TempSrc: Oral   Oral  Resp: 18   18  Height:      Weight:      SpO2: 100%   100%    Intake/Output Summary (Last 24 hours) at 09/16/12 1618 Last data filed at 09/16/12 1230  Gross per 24 hour  Intake    720 ml  Output   2251 ml   Net  -1531 ml   Filed Weights   09/14/12 2210  Weight: 40.824 kg (90 lb)    Exam:   General:  No distress.   Cardiovascular: S 1, S 2 RRR  Respiratory: CTA  Abdomen: Bs present, soft, NT  Musculoskeletal: no edema.   Data Reviewed: Basic Metabolic Panel:  Recent Labs Lab 09/13/12 1200 09/14/12 1549 09/15/12 0130 09/16/12 0418  NA 132* 132* 133* 144  K 3.9 3.4* 3.3* 3.8  CL 92* 98 98 101  CO2 27 24 24 26   GLUCOSE 124* 123* 111* 127*  BUN 24* 20 24* 27*  CREATININE 0.90 0.91 0.98 1.11*  CALCIUM 9.9 9.0 9.0 9.5  MG  --   --   --  2.1   Liver Function Tests:  Recent Labs Lab 09/13/12 1200 09/14/12 1549  AST 31 23  ALT 29 24  ALKPHOS 87 74  BILITOT 0.2* 0.2*  PROT 6.9 6.3  ALBUMIN 3.7 3.5   No results found for this basename: LIPASE, AMYLASE,  in the last 168 hours No results found for this basename: AMMONIA,  in the last 168 hours CBC:  Recent Labs Lab 09/13/12 1200 09/14/12 1549 09/15/12 0130  WBC 8.0 6.3 6.4  NEUTROABS 6.1 4.2  --   HGB 13.8 13.3 12.4  HCT 41.1 38.8 36.7  MCV 97.2 94.4 95.3  PLT 253 242 230   Cardiac Enzymes:  Recent Labs Lab 09/13/12 1200 09/14/12 1944 09/15/12 0126 09/15/12  0759  CKTOTAL 121  --   --   --   TROPONINI  --  <0.30 <0.30 <0.30   BNP (last 3 results) No results found for this basename: PROBNP,  in the last 8760 hours CBG: No results found for this basename: GLUCAP,  in the last 168 hours  No results found for this or any previous visit (from the past 240 hour(s)).   Studies: Dg Chest 2 View  09/14/2012   *RADIOLOGY REPORT*  Clinical Data: Hypertension and syncope.  CHEST - 2 VIEW  Comparison: 09/13/2012  Findings: Hyperinflation.  Moderate osteopenia.  Midline trachea.  Normal heart size.  Age advanced aortic atherosclerosis. No pleural effusion or pneumothorax.  Mild peribronchial thickening.  Breast implants. No lobar consolidation.  IMPRESSION: Hyperinflation/COPD. No acute superimposed process.    Original Report Authenticated By: Jeronimo Greaves, M.D.   Ct Head Wo Contrast  09/14/2012   *RADIOLOGY REPORT*  Clinical Data: Blurred vision.  CT HEAD WITHOUT CONTRAST  Technique:  Contiguous axial images were obtained from the base of the skull through the vertex without contrast.  Comparison: CT scan dated 09/02/2012  Findings: There is no acute intracranial hemorrhage, infarction, or mass lesion.  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  Slight periventricular white matter lucency on the left consistent with chronic small vessel ischemic disease.  No acute osseous abnormality.  Evidence of previous surgery in the posterior superior aspects of each eye.  IMPRESSION: No acute intracranial abnormality.   Original Report Authenticated By: Francene Boyers, M.D.   Mr Brain Wo Contrast  09/15/2012   *RADIOLOGY REPORT*  Clinical Data: Accelerated hypertension.  Memory loss.  Headaches. Rule out infarct.  MRI HEAD WITHOUT CONTRAST  Technique:  Multiplanar, multiecho pulse sequences of the brain and surrounding structures were obtained according to standard protocol without intravenous contrast.  Comparison: 09/14/2012 CT.  No comparison MR.  Findings: Motion degraded exam.  No acute infarct.  Remote left corona radiata infarct with small area of involvement of the superior lateral left thalamus and superior medial left lenticular nucleus.  Small vessel disease type changes.  No intracranial hemorrhage.  No intracranial mass lesion detected on this unenhanced exam.  Major intracranial vascular structures are patent with diminutive size right vertebral artery which may be congenitally small.  Cervical medullary junction, pituitary region, pineal region and orbital structures unremarkable.  IMPRESSION: Motion degraded exam.  No acute infarct.  Remote left corona radiata infarct with small area of involvement of the superior lateral left thalamus and superior medial left lenticular nucleus.  Small vessel disease type changes.    Original Report Authenticated By: Lacy Duverney, M.D.    Scheduled Meds: . amLODipine  5 mg Oral Daily  . aspirin  81 mg Oral Daily  . baclofen  5 mg Oral BID  . feeding supplement  237 mL Oral TID WC  . heparin  5,000 Units Subcutaneous Q8H  . hydrochlorothiazide  25 mg Oral Daily  . pantoprazole  40 mg Oral Daily  . potassium chloride  40 mEq Oral BID  . sodium chloride  500 mL Intravenous Once  . sodium chloride  3 mL Intravenous Q12H  . sucralfate  1 g Oral QID  . topiramate  100 mg Oral 5 X Daily   Continuous Infusions:   Principal Problem:   Hypertensive urgency Active Problems:   Polypharmacy   Anxiety   GERD (gastroesophageal reflux disease)   Seizure    Time spent:    Sharolyn Weber  [facility] Pager  [phone]. If 7PM-7AM, please contact night-coverage at [email or web address], password [facility] 09/16/2012, 4:18 PM  LOS: 2 days

## 2012-09-16 NOTE — Progress Notes (Signed)
Occupational Therapy Treatment Patient Details Name: [name] MRN: [id number] DOB: [birth year] Today's Date: 09/16/2012 Time: 4098-1191 OT Time Calculation (min): 31 min  OT Assessment / Plan / Recommendation Comments on Treatment Session Pt tolerated session well. Pt will benefit from rehab at SNF to imiprove strengtha dn endurance and reduce chance for readmission. Pt given wirtten protocol to share with SNF OT to begin working on R wrist.     Follow Up Recommendations  SNF    Barriers to Discharge  Decreased caregiver support    Equipment Recommendations  None recommended by OT    Recommendations for Other Services    Frequency Min 2X/week   Plan Discharge plan remains appropriate    Precautions / Restrictions Precautions Precautions: Fall Restrictions Weight Bearing Restrictions: No   Pertinent Vitals/Pain Vitals stable. Pain 5 R wrist     ADL  Eating/Feeding: Independent Where Assessed - Eating/Feeding: Chair Grooming: Supervision/safety Where Assessed - Grooming: Unsupported standing Upper Body Bathing: Set up Where Assessed - Upper Body Bathing: Unsupported sitting Lower Body Bathing: Supervision/safety Where Assessed - Lower Body Bathing: Unsupported sit to stand Upper Body Dressing: Set up Where Assessed - Upper Body Dressing: Unsupported sitting Lower Body Dressing: Set up Where Assessed - Lower Body Dressing: Unsupported sit to stand Toilet Transfer: Supervision/safety Toilet Transfer Method: Other (comment) (ambulating) Acupuncturist: Comfort height toilet Toileting - Clothing Manipulation and Hygiene: Independent Where Assessed - Engineer, mining and Hygiene: Sit to stand from 3-in-1 or toilet Transfers/Ambulation Related to ADLs: S ADL Comments: focus of session on rehab for R wrist. Pt given instructions to use heat prior to stretching, then prolonged wrist ext (prayer) stretch, CTS stretches, theraputty strengthening  exercises and following with ice.     OT Diagnosis: Generalized weakness;Acute pain  OT Problem List: Decreased strength;Decreased activity tolerance;Decreased safety awareness;Decreased knowledge of use of DME or AE;Cardiopulmonary status limiting activity;Pain;Impaired UE functional use OT Treatment Interventions: Self-care/ADL training;Therapeutic exercise;Energy conservation;DME and/or AE instruction;Therapeutic activities;Patient/family education   OT Goals Acute Rehab OT Goals OT Goal Formulation: With patient Time For Goal Achievement: 09/30/12 Potential to Achieve Goals: Good Arm Goals Pt Will Perform AROM: with supervision, verbal cues required/provided;Right upper extremity;2 sets;Other (comment) Arm Goal: AROM - Progress: Progressing toward goal Pt Will Complete Theraputty Exer: Independently;to increase strength;Right upper extremity;Min resistance putty Arm Goal: Theraputty Exercises - Progress: Progressing toward goal Additional Arm Goal #1: Complete CTS stretches mod I with use of written progra. Arm Goal: Additional Goal #1 - Progress: Progressing toward goals  Visit Information  Last OT Received On: 09/16/12 Assistance Needed: +1    Subjective Data      Prior Functioning  Home Living Lives With: Alone Available Help at Discharge: Friend(s) Type of Home: House Home Access: Stairs to enter Secretary/administrator of Steps: 1 Entrance Stairs-Rails: None Home Layout: One level Bathroom Shower/Tub: Engineer, manufacturing systems: Standard Bathroom Accessibility: Yes How Accessible: Accessible via walker Home Adaptive Equipment: Walker - rolling Prior Function Level of Independence: Independent Able to Take Stairs?: Yes Communication Communication: No difficulties Dominant Hand: Right    Cognition  Cognition Arousal/Alertness: Awake/alert Behavior During Therapy: Anxious Overall Cognitive Status: Within Functional Limits for tasks assessed    Mobility   Bed Mobility Bed Mobility: Supine to Sit;Sit to Supine Rolling Right: 7: Independent Rolling Left: 7: Independent Transfers Transfers: Sit to Stand;Stand to Sit Sit to Stand: 6: Modified independent (Device/Increase time) Stand to Sit: 6: Modified independent (Device/Increase time)    Exercises  Other  Exercises Other Exercises: prayer stretch Other Exercises: CTS stretch sequence Other Exercises: theraputty exercises - yellow. handout given Other Exercises: use of ice to decrase edema   Balance Balance Balance Assessed: Yes Static Sitting Balance Static Sitting - Balance Support: Feet supported Static Sitting - Level of Assistance: 7: Independent Static Standing Balance Static Standing - Balance Support: No upper extremity supported Static Standing - Level of Assistance: 6: Modified independent (Device/Increase time) High Level Balance High Level Balance Activites: Head turns;Sudden stops;Direction changes;Turns High Level Balance Comments: S   End of Session OT - End of Session Activity Tolerance: Patient tolerated treatment well Patient left: in bed;with call bell/phone within reach Nurse Communication: Mobility status;Other (comment) (D/C plan)  GO Functional Assessment Tool Used: clinical assessment Functional Limitation: Self care Self Care Current Status (Q6578): At least 1 percent but less than 20 percent impaired, limited or restricted Self Care Goal Status (I6962): At least 1 percent but less than 20 percent impaired, limited or restricted   Amedee Cerrone,[name] 09/16/2012, 3:13 PM [facility], OTR/L  [phone] 09/16/2012 [facility], OTR/L  [phone] 09/16/2012

## 2012-09-16 NOTE — Clinical Social Work Note (Signed)
CSW spoke with pt son who is reviewing SNFs.  Pt son stated that he wishes to review these with the pt instead of the CSW.  Explained that CSW would confirm with mom what son is reporting.  Son is agreeable to this plan.  Son received bed offers.  Son will report choice by the morning.  CSW explained that pt will be d/c tomorrow if remains medically cleared.    Vickii Penna, LCSWA [phone]  Clinical Social Work

## 2012-09-16 NOTE — Progress Notes (Addendum)
Occupational Therapy Evaluation Patient Details Name: [name] MRN: [id number] DOB: [birth year] Today's Date: 09/16/2012 Time: 1335-1400 OT Time Calculation (min): 25 min  OT Assessment / Plan / Recommendation Clinical Impression  62 yo female admitted with hypertension and episodes of syncope at home. PTA, pt independent with mobility and ADL. Pt reports several hospital admissions lately. Pt underwent R wrist surgery 3-14 and is having symptoms consistent with CTS. Will give pt exercises and stretches for CTS to take with her to next facility.  Pt will benefit from skilled OT services to facilitate D/C to SNF for rehab due to below deficits.    OT Assessment  Patient needs continued OT Services    Follow Up Recommendations  SNF    Barriers to Discharge Decreased caregiver support    Equipment Recommendations  None recommended by OT    Recommendations for Other Services    Frequency  Min 2X/week    Precautions / Restrictions Precautions Precautions: Fall Restrictions Weight Bearing Restrictions: No   Pertinent Vitals/Pain pain 6.R wrist    ADL  Eating/Feeding: Independent Where Assessed - Eating/Feeding: Chair Grooming: Supervision/safety Where Assessed - Grooming: Unsupported standing Upper Body Bathing: Set up Where Assessed - Upper Body Bathing: Unsupported sitting Lower Body Bathing: Supervision/safety Where Assessed - Lower Body Bathing: Unsupported sit to stand Upper Body Dressing: Set up Where Assessed - Upper Body Dressing: Unsupported sitting Lower Body Dressing: Set up Where Assessed - Lower Body Dressing: Unsupported sit to stand Toilet Transfer: Supervision/safety Toilet Transfer Method: Other (comment) (ambulating) Acupuncturist: Comfort height toilet Toileting - Clothing Manipulation and Hygiene: Independent Where Assessed - Toileting Clothing Manipulation and Hygiene: Sit to stand from 3-in-1 or toilet Transfers/Ambulation Related  to ADLs: S ADL Comments: overall set up    OT Diagnosis: Generalized weakness;Acute pain  OT Problem List: Decreased strength;Decreased activity tolerance;Decreased safety awareness;Decreased knowledge of use of DME or AE;Cardiopulmonary status limiting activity;Pain;Impaired UE functional use OT Treatment Interventions: Self-care/ADL training;Therapeutic exercise;Energy conservation;DME and/or AE instruction;Therapeutic activities;Patient/family education   OT Goals Acute Rehab OT Goals OT Goal Formulation: With patient Time For Goal Achievement: 09/30/12 Potential to Achieve Goals: Good Arm Goals Pt Will Perform AROM: with supervision, verbal cues required/provided;Right upper extremity;2 sets;Other (comment) (carpal tunnel exercises) Arm Goal: AROM - Progress: Goal set today Pt Will Complete Theraputty Exer: Independently;to increase strength;Right upper extremity;Min resistance putty Arm Goal: Theraputty Exercises - Progress: Goal set today Additional Arm Goal #1: Complete CTS stretches mod I with use of written progra. Arm Goal: Additional Goal #1 - Progress: Goal set today  Visit Information  Last OT Received On: 09/09/12 Assistance Needed: +1    Subjective Data      Prior Functioning     Home Living Lives With: Alone Available Help at Discharge: Friend(s) Type of Home: House Home Access: Stairs to enter Secretary/administrator of Steps: 1 Entrance Stairs-Rails: None Home Layout: One level Bathroom Shower/Tub: Engineer, manufacturing systems: Standard Bathroom Accessibility: Yes How Accessible: Accessible via walker Home Adaptive Equipment: Walker - rolling Prior Function Level of Independence: Independent Able to Take Stairs?: Yes Communication Communication: No difficulties Dominant Hand: Right         Vision/Perception     Cognition  Cognition Arousal/Alertness: Awake/alert Behavior During Therapy: Anxious Overall Cognitive Status: Within  Functional Limits for tasks assessed    Extremity/Trunk Assessment Right Upper Extremity Assessment RUE ROM/Strength/Tone: Deficits (s/p R wrist fracture) RUE ROM/Strength/Tone Deficits: generalized weaknessq RUE Sensation: Deficits;WFL - Proprioception RUE Sensation Deficits: deficits in  median/ulnar n distribution from previous injury RUE Coordination: Deficits (due to impairment and weakness) Left Upper Extremity Assessment LUE ROM/Strength/Tone: WFL for tasks assessed LUE Sensation: WFL - Light Touch;WFL - Proprioception LUE Coordination: WFL - gross/fine motor Right Lower Extremity Assessment RLE ROM/Strength/Tone: WFL for tasks assessed Left Lower Extremity Assessment LLE ROM/Strength/Tone: WFL for tasks assessed Trunk Assessment Trunk Assessment: Normal     Mobility Bed Mobility Bed Mobility: Supine to Sit;Sit to Supine Rolling Right: 7: Independent Rolling Left: 7: Independent Transfers Transfers: Sit to Stand;Stand to Sit Sit to Stand: 6: Modified independent (Device/Increase time) Stand to Sit: 6: Modified independent (Device/Increase time)     Exercise     Balance Balance Balance Assessed: Yes Static Sitting Balance Static Sitting - Balance Support: Feet supported Static Sitting - Level of Assistance: 7: Independent Static Standing Balance Static Standing - Balance Support: No upper extremity supported Static Standing - Level of Assistance: 6: Modified independent (Device/Increase time) High Level Balance High Level Balance Activites: Head turns;Sudden stops;Direction changes;Turns High Level Balance Comments: S   End of Session OT - End of Session Activity Tolerance: Patient tolerated treatment well Patient left: in bed;with call bell/phone within reach Nurse Communication: Mobility status;Other (comment) (D/C plan)  GO Functional Assessment Tool Used: clinical assessment Functional Limitation: Self care Self Care Current Status (Z6109): At least 1  percent but less than 20 percent impaired, limited or restricted Self Care Goal Status (U0454): At least 1 percent but less than 20 percent impaired, limited or restricted   [name],[name] 09/16/2012, 2:23 PM [facility], OTR/L  [phone] 09/16/2012

## 2012-09-16 NOTE — Progress Notes (Signed)
Physical Therapy Treatment Patient Details Name: [name] MRN: [id number] DOB: 08-15-1950 Today's Date: 09/16/2012 Time: 0926-0950 PT Time Calculation (min): 24 min  PT Assessment / Plan / Recommendation Comments on Treatment Session  Pt adm with near syncope and hypertensive urgency.  Pt remains unsteady and feels weak. Family and pt want pt to be stronger prior to return home.  Recommend ST-SNF.    Follow Up Recommendations  SNF     Does the patient have the potential to tolerate intense rehabilitation     Barriers to Discharge        Equipment Recommendations  None recommended by PT    Recommendations for Other Services    Frequency Min 3X/week   Plan Discharge plan needs to be updated;Frequency remains appropriate    Precautions / Restrictions Precautions Precautions: Fall   Pertinent Vitals/Pain See flow sheet    Mobility  Bed Mobility Bed Mobility: Sit to Supine Sit to Supine: 7: Independent Transfers Sit to Stand: 6: Modified independent (Device/Increase time);With upper extremity assist;From bed Stand to Sit: 6: Modified independent (Device/Increase time);With upper extremity assist;To bed Ambulation/Gait Ambulation/Gait Assistance: 4: Min assist;4: Min guard Ambulation Distance (Feet): 150 Feet (x 2) Assistive device: 1 person hand held assist;Rolling walker Ambulation/Gait Assistance Details: Pt used hand held x 150' and then rolling walker 150'. More stable with walker and only needed min guard assist. Gait Pattern: Step-through pattern;Decreased stride length General Gait Details: Remains unsteady.    Exercises     PT Diagnosis:    PT Problem List:   PT Treatment Interventions:     PT Goals Acute Rehab PT Goals PT Goal: Sit to Stand - Progress: Progressing toward goal PT Goal: Stand to Sit - Progress: Progressing toward goal PT Goal: Ambulate - Progress: Progressing toward goal  Visit Information  Last PT Received On: 09/16/12 Assistance  Needed: +1    Subjective Data  Subjective: Pt states that she feels weak and shaky.   Cognition  Cognition Arousal/Alertness: Awake/alert Behavior During Therapy: WFL for tasks assessed/performed Overall Cognitive Status: Within Functional Limits for tasks assessed    Balance  Static Standing Balance Static Standing - Balance Support: No upper extremity supported Static Standing - Level of Assistance: 5: Stand by assistance  End of Session PT - End of Session Equipment Utilized During Treatment: Gait belt Activity Tolerance: Patient tolerated treatment well Patient left: in bed;with call bell/phone within reach Nurse Communication: Mobility status   GP     [facility] 09/16/2012, 9:59 AM  [facility] PT [phone]

## 2012-09-16 NOTE — Progress Notes (Signed)
[facility]  Patient Status: Active (receiving services up to time of hospitalization)  [facility] is providing the following services: RN  If patient discharges after hours, please call [phone].   Jodene Nam 09/16/2012, 4:10 PM

## 2012-09-16 NOTE — Clinical Social Work Note (Signed)
CSW attempted to contact pt son re: bed offers.  CSW received vm.  CSW awaiting a return call.    Tammy Sours - [phone]  Vickii Penna, LCSWA [phone]  Clinical Social Work

## 2012-09-17 DIAGNOSIS — K219 Gastro-esophageal reflux disease without esophagitis: Secondary | ICD-10-CM

## 2012-09-17 DIAGNOSIS — I10 Essential (primary) hypertension: Secondary | ICD-10-CM

## 2012-09-17 DIAGNOSIS — Z79899 Other long term (current) drug therapy: Secondary | ICD-10-CM

## 2012-09-17 DIAGNOSIS — R569 Unspecified convulsions: Secondary | ICD-10-CM

## 2012-09-17 LAB — BASIC METABOLIC PANEL
CO2: 23 mEq/L (ref 19–32)
Calcium: 8.7 mg/dL (ref 8.4–10.5)
GFR calc non Af Amer: 69 mL/min — ABNORMAL LOW (ref >90)
Sodium: 133 mEq/L — ABNORMAL LOW (ref 135–145)

## 2012-09-17 MED ORDER — POTASSIUM CHLORIDE CRYS ER 20 MEQ PO TBCR: 40.0000 meq | EXTENDED_RELEASE_TABLET | Freq: Every day | ORAL | Status: DC

## 2012-09-17 MED ORDER — HYDROCHLOROTHIAZIDE 25 MG PO TABS: 25.0000 mg | ORAL_TABLET | Freq: Every day | ORAL | Status: DC

## 2012-09-17 MED ORDER — AMLODIPINE BESYLATE 5 MG PO TABS: 10.0000 mg | ORAL_TABLET | Freq: Every day | ORAL | Status: DC

## 2012-09-17 MED ORDER — FLUTICASONE PROPIONATE 50 MCG/ACT NA SUSP: 1.0000 | Freq: Two times a day (BID) | NASAL | Status: DC

## 2012-09-17 MED ORDER — AMLODIPINE BESYLATE 10 MG PO TABS
10.0000 mg | ORAL_TABLET | Freq: Every day | ORAL | Status: DC
  Administered 2012-09-17: 10 mg via ORAL
  Filled 2012-09-17: qty 1

## 2012-09-17 MED ORDER — CLONAZEPAM 0.25 MG PO TBDP: 0.2500 mg | ORAL_TABLET | Freq: Three times a day (TID) | ORAL | Status: DC | PRN

## 2012-09-17 MED ORDER — CLONAZEPAM 0.5 MG PO TABS: 0.2500 mg | ORAL_TABLET | Freq: Three times a day (TID) | ORAL | Status: DC | PRN

## 2012-09-17 MED ORDER — FLUTICASONE PROPIONATE 50 MCG/ACT NA SUSP
1.0000 | Freq: Two times a day (BID) | NASAL | Status: DC
  Administered 2012-09-17: 1 via NASAL
  Filled 2012-09-17: qty 16

## 2012-09-17 NOTE — Discharge Summary (Signed)
Physician Discharge Summary  [name]:[id number] DOB: 04-Mar-1951 DOA: 09/14/2012  PCP: Angelica Chessman., MD  Admit date: 09/14/2012 Discharge date: 09/17/2012  Time spent: 45 minutes  Recommendations for Outpatient Follow-up:  1. PCP in 1 week 2. Bmet in 3-4 days  Discharge Diagnoses:  Principal Problem:   Hypertensive urgency Active Problems:   Polypharmacy   Anxiety   GERD (gastroesophageal reflux disease)   Seizure   Discharge Condition: improved  Diet recommendation: low sodium  Filed Weights   09/14/12 2210  Weight: 40.824 kg (90 lb)    History of present illness:  [name] is a 62 y.o. female with past medical history of hypertension, anxiety and seizure disorder. Patient came in to the hospital because of elevated blood pressure. Patient said for the past 2 days she wasn't feeling but, yesterday she had blurred vision, some headaches and she felt overall slow, so she went to [facility] med center, she was found to have blood pressure of 218/110, blood pressure was controlled with her oral medications plus IV hydralazine and she was sent home. Patient had the same symptoms recurred again today with blurry vision, difficulty with walking, and she didn't know what exactly she lost her consciousness or not. Patient called a friend and the friend called EMS and they brought her to the hospital for further evaluation. She was found to have blood pressure of 205/95.   Hospital Course:  1- Hypertensive urgency: with symptoms of headache, blury vision, dizziness She was initially treated with IV labetalol and Hydralazine Then transitioned to PO HCTZ and Amlodipine She had been using a lot of oxymetazoline for nasal congestion which i suspect was the cause of this, this has been stopped. Losartan was stopped due to mild renal insufficiency on admission which has since resolved  2-Polypharmacy suspected to be contributing to her falls and poor baseline  status -stopped baclofen, continue klonopin PRN, limit sedatives as much as possible  3-Seizure disorder: Continue with Topamax   4-Headache, Blurry vision, Weakness: related to HTN urgency. MRI negative for acute stroke. Patient is aware that she had prior stroke. She relates that she had a work up, continue Aspirin daily.  5. Severe anxiety: on klonopin PRN, FU with PSychiatry  6-Severe Malnutrition: Ensure TID between meals encouraged .  7-Renal insufficiency: resolved with IVF. Cozaar discontinued  Being discharged to Rehab for physical rehabilitation     Discharge Exam: Filed Vitals:   09/16/12 0935 09/16/12 1308 09/16/12 1943 09/17/12 0436  BP: 126/91 126/78 131/77 161/90  Pulse:  88 91 83  Temp:  98.1 F (36.7 C) 98.3 F (36.8 C) 97.9 F (36.6 C)  TempSrc:  Oral Oral Oral  Resp:  18 18 18   Height:      Weight:      SpO2:  100% 99% 100%    General: AAOx3 Cardiovascular: S1S2/RRR Respiratory: CTAB  Discharge Instructions  Discharge Orders   Future Orders Complete By Expires     Diet - low sodium heart healthy  As directed     Increase activity slowly  As directed         Medication List    STOP taking these medications       baclofen 10 MG tablet  Commonly known as:  LIORESAL     ibuprofen 200 MG tablet  Commonly known as:  ADVIL,MOTRIN     losartan 100 MG tablet  Commonly known as:  COZAAR     NASAL SPRAY 12 HOUR 0.05 %  nasal spray  Generic drug:  oxymetazoline     POTASSIMIN PO     potassium gluconate 595 MG Tabs     prochlorperazine 10 MG tablet  Commonly known as:  COMPAZINE      TAKE these medications       amLODipine 5 MG tablet  Commonly known as:  NORVASC  Take 2 tablets (10 mg total) by mouth daily.     BEANO PO  Take 1 tablet by mouth daily as needed (For flatulence).     Biotin 5 MG Caps  Take 1 capsule by mouth daily.     CALCIUM 1200 PO  Take 1 capsule by mouth daily.     clonazePAM 0.25 MG disintegrating tablet   Commonly known as:  KLONOPIN  Take 1 tablet (0.25 mg total) by mouth 3 (three) times daily as needed.     CoQ10 100 MG Caps  Take 1 capsule by mouth daily.     Coenzyme Q10 200 MG capsule  Take 200 mg by mouth daily.     docusate sodium 100 MG capsule  Commonly known as:  COLACE  Take 100 mg by mouth 2 (two) times daily.     fluticasone 50 MCG/ACT nasal spray  Commonly known as:  FLONASE  Place 1 spray into the nose 2 (two) times daily at 10 AM and 5 PM.     hydrochlorothiazide 25 MG tablet  Commonly known as:  HYDRODIURIL  Take 1 tablet (25 mg total) by mouth daily.     Magnesium 500 MG Tabs  Take 1 tablet by mouth daily.     MAGNESIUM PO  Take 1 tablet by mouth daily.     meclizine 25 MG tablet  Commonly known as:  ANTIVERT  Take 25 mg by mouth 3 (three) times daily as needed.     MULTI-VITAMIN PO  Take 1 tablet by mouth daily.     omeprazole 20 MG capsule  Commonly known as:  PRILOSEC  Take 20 mg by mouth daily.     potassium chloride SA 20 MEQ tablet  Commonly known as:  K-DUR,KLOR-CON  Take 2 tablets (40 mEq total) by mouth daily.     pravastatin 40 MG tablet  Commonly known as:  PRAVACHOL  Take 60 mg by mouth daily.     promethazine 25 MG tablet  Commonly known as:  PHENERGAN  Take 25 mg by mouth every 6 (six) hours as needed for nausea.     ranitidine 150 MG tablet  Commonly known as:  ZANTAC  Take 150 mg by mouth 2 (two) times daily.     senna-docusate 8.6-50 MG per tablet  Commonly known as:  Senokot-S  Take 2 tablets by mouth daily as needed for constipation.     sucralfate 1 G tablet  Commonly known as:  CARAFATE  Take 1 g by mouth 4 (four) times daily.     topiramate 100 MG tablet  Commonly known as:  TOPAMAX  Take 100 mg by mouth 5 (five) times daily.     VANQUISH [phone] MG Tabs  Generic drug:  ASA-APAP-Caff Buffered  Take 1 tablet by mouth daily as needed (For headache.).     Vitamin D3 5000 UNITS Caps  Take 1 capsule by mouth  daily.       Allergies  Allergen Reactions  . Codeine Swelling  . Other Hives    Seizure meds, dilantin  . Shellfish Allergy   . Toradol (Ketorolac Tromethamine)  hullications       Follow-up Information   Follow up with Angelica Chessman., MD. Schedule an appointment as soon as possible for a visit in 1 week.   Contact information:   [address] [place] [address] [phone]        The results of significant diagnostics from this hospitalization (including imaging, microbiology, ancillary and laboratory) are listed below for reference.    Significant Diagnostic Studies: Dg Chest 2 View  09/14/2012   *RADIOLOGY REPORT*  Clinical Data: Hypertension and syncope.  CHEST - 2 VIEW  Comparison: 09/13/2012  Findings: Hyperinflation.  Moderate osteopenia.  Midline trachea.  Normal heart size.  Age advanced aortic atherosclerosis. No pleural effusion or pneumothorax.  Mild peribronchial thickening.  Breast implants. No lobar consolidation.  IMPRESSION: Hyperinflation/COPD. No acute superimposed process.   Original Report Authenticated By: Jeronimo Greaves, M.D.   Dg Chest 2 View  09/13/2012   *RADIOLOGY REPORT*  Clinical Data: Anxiety.  Hypertension.  CHEST - 2 VIEW  Comparison: 09/02/2012  Findings: Lungs remain hyperaerated without new nodule or consolidation.  Normal heart size.  Overlying leads cause artifact over the upper lung zones.  No pneumothorax and no pleural effusion.  IMPRESSION: No active cardiopulmonary disease.  Changes related to COPD are stable.   Original Report Authenticated By: Jolaine Click, M.D.   Dg Chest 2 View  09/02/2012   *RADIOLOGY REPORT*  Clinical Data: Weakness and confusion  CHEST - 2 VIEW  Comparison: Chest radiograph 12/05 1012  Findings: Normal cardiac silhouette.  Lungs are hyperinflated.  No effusion, infiltrate, pneumothorax.  No aggressive osseous lesions.  IMPRESSION: Hyperinflated lungs without acute cardiopulmonary findings.    Original Report Authenticated By: Genevive Bi, M.D.   Ct Head Wo Contrast  09/14/2012   *RADIOLOGY REPORT*  Clinical Data: Blurred vision.  CT HEAD WITHOUT CONTRAST  Technique:  Contiguous axial images were obtained from the base of the skull through the vertex without contrast.  Comparison: CT scan dated 09/02/2012  Findings: There is no acute intracranial hemorrhage, infarction, or mass lesion.  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  Slight periventricular white matter lucency on the left consistent with chronic small vessel ischemic disease.  No acute osseous abnormality.  Evidence of previous surgery in the posterior superior aspects of each eye.  IMPRESSION: No acute intracranial abnormality.   Original Report Authenticated By: Francene Boyers, M.D.   Ct Head Wo Contrast  09/02/2012   *RADIOLOGY REPORT*  Clinical Data: 62 year old female confusion.  Fall.  Weakness. Extremity pain.  CT HEAD WITHOUT CONTRAST  Technique:  Contiguous axial images were obtained from the base of the skull through the vertex without contrast.  Comparison: 10/22/2007.  Findings: Visualized paranasal sinuses and mastoids are clear. Midline forehead scalp hematoma measuring up to 4 mm in thickness. Frontal sinuses are hypoplastic.  Frontal bones intact.  Scalp and orbit soft tissues elsewhere within normal limits (postoperative changes to the superior globes again noted. No acute osseous abnormality identified.  Calcified atherosclerosis at the skull base.  No ventriculomegaly. Stable cerebral volume. No midline shift, mass effect, or evidence of mass lesion.  Progressed hypodensity of the anterior left corona radiata, may also involve the anterior limb of the left internal capsule and the left caudate.  Elsewhere Stable gray-white matter differentiation throughout the brain.  No evidence of cortically based acute infarction identified.  No acute intracranial hemorrhage identified.  No suspicious intracranial vascular hyperdensity.   IMPRESSION: 1.  Small vessel ischemia of the left  white matter capsules new or progressed since 2009. 2.  Forehead scalp soft tissue injury without underlying fracture.   Original Report Authenticated By: Erskine Speed, M.D.   Mr Brain Wo Contrast  09/15/2012   *RADIOLOGY REPORT*  Clinical Data: Accelerated hypertension.  Memory loss.  Headaches. Rule out infarct.  MRI HEAD WITHOUT CONTRAST  Technique:  Multiplanar, multiecho pulse sequences of the brain and surrounding structures were obtained according to standard protocol without intravenous contrast.  Comparison: 09/14/2012 CT.  No comparison MR.  Findings: Motion degraded exam.  No acute infarct.  Remote left corona radiata infarct with small area of involvement of the superior lateral left thalamus and superior medial left lenticular nucleus.  Small vessel disease type changes.  No intracranial hemorrhage.  No intracranial mass lesion detected on this unenhanced exam.  Major intracranial vascular structures are patent with diminutive size right vertebral artery which may be congenitally small.  Cervical medullary junction, pituitary region, pineal region and orbital structures unremarkable.  IMPRESSION: Motion degraded exam.  No acute infarct.  Remote left corona radiata infarct with small area of involvement of the superior lateral left thalamus and superior medial left lenticular nucleus.  Small vessel disease type changes.   Original Report Authenticated By: Lacy Duverney, M.D.    Microbiology: No results found for this or any previous visit (from the past 240 hour(s)).   Labs: Basic Metabolic Panel:  Recent Labs Lab 09/13/12 1200 09/14/12 1549 09/15/12 0130 09/16/12 0418 09/17/12 0440  NA 132* 132* 133* 144 133*  K 3.9 3.4* 3.3* 3.8 3.2*  CL 92* 98 98 101 97  CO2 27 24 24 26 23   GLUCOSE 124* 123* 111* 127* 110*  BUN 24* 20 24* 27* 21  CREATININE 0.90 0.91 0.98 1.11* 0.88  CALCIUM 9.9 9.0 9.0 9.5 8.7  MG  --   --   --  2.1  --     Liver Function Tests:  Recent Labs Lab 09/13/12 1200 09/14/12 1549  AST 31 23  ALT 29 24  ALKPHOS 87 74  BILITOT 0.2* 0.2*  PROT 6.9 6.3  ALBUMIN 3.7 3.5   No results found for this basename: LIPASE, AMYLASE,  in the last 168 hours No results found for this basename: AMMONIA,  in the last 168 hours CBC:  Recent Labs Lab 09/13/12 1200 09/14/12 1549 09/15/12 0130  WBC 8.0 6.3 6.4  NEUTROABS 6.1 4.2  --   HGB 13.8 13.3 12.4  HCT 41.1 38.8 36.7  MCV 97.2 94.4 95.3  PLT 253 242 230   Cardiac Enzymes:  Recent Labs Lab 09/13/12 1200 09/14/12 1944 09/15/12 0126 09/15/12 0759  CKTOTAL 121  --   --   --   TROPONINI  --  <0.30 <0.30 <0.30   BNP: BNP (last 3 results) No results found for this basename: PROBNP,  in the last 8760 hours CBG: No results found for this basename: GLUCAP,  in the last 168 hours     Signed:  Kynslei Art  [facility] 09/17/2012, 12:41 PM

## 2012-09-17 NOTE — Clinical Social Work Note (Addendum)
CSW received a call from [facility] who informed CSW that pt was OBS.  CSW relayed this information to covering CSW.  This CSW received a call from [place], pt son who was reviewing bed offers.  This CSW explained the OBS vs IP status to son re: Medicare coverage for SNF.  Pt family would need to pay out of pocket in order to be admitted to SNF.  Sons are not financially able to do so.  Son was understanding.  CSW advised son that pt may qualify for home health and that RNCM would better assist with those needs.  CSW relayed the unit phone # to son and gave son, [facility] name.  CSW remains available to assist as necessary.  CSW informed covering CSW and RNCM.  Vickii Penna, LCSWA [phone]  Clinical Social Work

## 2012-09-17 NOTE — Clinical Social Work Note (Signed)
Clinical Child psychotherapist spoke with CM regarding decision for SNF, [facility]. CSW called and spoke with son Susy Frizzle who confirmed discharge plans for SNF. Son reported that he will transport patient to the facility. CSW will have discharge packet completed by 2pm for family to take to facility. CSW called facility and they are ready to receive patient today. CSW will sign off, as social work intervention is no longer needed.   Rozetta Nunnery MSW, [employer] [phone]

## 2012-09-17 NOTE — Progress Notes (Signed)
Discussed in detail pt's medication list with pt and pt's son. Also returned medications that were placed in pharmacy vault. Gave medications to son in paper bag. Pt and son verbalized their understanding of the medications. Gave pt's son packet to give to SNF, son will be transporting pt to SNF. Pt is stable for discharge.

## 2012-09-17 NOTE — Progress Notes (Signed)
Called report to [facility]. Pt should be discharged this afternoon.

## 2012-09-17 NOTE — Care Management Note (Signed)
    Page 1 of 1   09/17/2012     1:30:51 PM   CARE MANAGEMENT NOTE 09/17/2012  Patient:  [name], [name]   Account Number:  [id number]  Date Initiated:  09/15/2012  Documentation initiated by:  Karsten Vaughn  Subjective/Objective Assessment:   PT ADM WITH HYPERTENSIVE URGENCY ON 09/14/12.  PTA, PT LIVES AT HOME ALONE.  SHE HAS HAD FREQUENT FALLS.     Action/Plan:   P.T. RECOMMENDING SNF PLACEMENT.   Anticipated DC Date:  09/17/2012   Anticipated DC Plan:  SKILLED NURSING FACILITY  In-house referral  Clinical Social Worker      DC Planning Services  CM consult      Choice offered to / List presented to:             Status of service:  Completed, signed off Medicare Important Message given?   (If response is "NO", the following Medicare IM given date fields will be blank) Date Medicare IM given:   Date Additional Medicare IM given:    Discharge Disposition:  SKILLED NURSING FACILITY  Per UR Regulation:  Reviewed for med. necessity/level of care/duration of stay  If discussed at Long Length of Stay Meetings, dates discussed:    Comments:  09/17/12 Lynnwood Beckford,RN,BSN [phone] PT ORIGINALLY THOUGHT TO HAVE MEDICARE PRIMARY, AND THEREFORE WOULD NOT BE ELIGIBLE FOR SNF STAY, DUE TO NEEDING 3 DAY INPATIENT STAY.  SHE IS OBSERVATION STATUS. AFTER DISCUSSION WITH FAMILY, REALIZED THAT A CLERICAL ERROR HAD OCCURRED IN ADMITTING, AND THEY HAD LISTED MEDICARE PRIMARY AND [employer] MEDICARE SECONDARY.  PT ACTUALLY HAS [employer] MEDICARE PRIMARY.  SHE IS THEN ELIGIBLE FOR SNF STAY, AND DOES NOT NEED 3 DAY IP STAY.  PT/FAMILY UPSET INITIALLY WHEN THEY THOUGHT PT WOULD NOT GO TO SNF TODAY, BUT ARE NOW RELIEVED.  CONTACTED CSW TO FACILITATE DC TO SNF; FAMILY INTERESTED IN [place].  MD MADE AWARE OF EVENTS--SHE WILL COMPLETE DC SUMMARY AND SIGN FL2.

## 2012-09-20 ENCOUNTER — Emergency Department (HOSPITAL_COMMUNITY)
Admission: EM | Admit: 2012-09-20 | Discharge: 2012-09-21 | Disposition: A | Discharge status: Home / Self Care | Payer: Medicare Other | Attending: Emergency Medicine | Admitting: Emergency Medicine

## 2012-09-20 DIAGNOSIS — I10 Essential (primary) hypertension: Secondary | ICD-10-CM | POA: Insufficient documentation

## 2012-09-20 DIAGNOSIS — R569 Unspecified convulsions: Secondary | ICD-10-CM | POA: Insufficient documentation

## 2012-09-20 DIAGNOSIS — M810 Age-related osteoporosis without current pathological fracture: Secondary | ICD-10-CM | POA: Insufficient documentation

## 2012-09-20 DIAGNOSIS — K219 Gastro-esophageal reflux disease without esophagitis: Secondary | ICD-10-CM | POA: Insufficient documentation

## 2012-09-20 DIAGNOSIS — R040 Epistaxis: Secondary | ICD-10-CM

## 2012-09-20 DIAGNOSIS — F411 Generalized anxiety disorder: Secondary | ICD-10-CM | POA: Insufficient documentation

## 2012-09-20 DIAGNOSIS — J3489 Other specified disorders of nose and nasal sinuses: Secondary | ICD-10-CM | POA: Insufficient documentation

## 2012-09-20 DIAGNOSIS — Z79899 Other long term (current) drug therapy: Secondary | ICD-10-CM | POA: Insufficient documentation

## 2012-09-20 DIAGNOSIS — K259 Gastric ulcer, unspecified as acute or chronic, without hemorrhage or perforation: Secondary | ICD-10-CM | POA: Insufficient documentation

## 2012-09-20 LAB — CBC
Platelets: 266 10*3/uL (ref 150–400)
RBC: 4.27 MIL/uL (ref 3.87–5.11)
WBC: 9 10*3/uL (ref 4.0–10.5)

## 2012-09-20 NOTE — ED Notes (Signed)
[id number]<KG> Expected date:<BR> Expected time:<BR> Means of arrival:<BR> Comments:<BR> EMS/61 yo female with nose bleed

## 2012-09-20 NOTE — ED Notes (Signed)
[facility] called for transport.  

## 2012-09-20 NOTE — ED Provider Notes (Signed)
History     CSN: [id number]  Arrival date & time 09/20/12  2046   First MD Initiated Contact with Patient 09/20/12 2049      Chief Complaint  Patient presents with  . Epistaxis   HPI  History provided by the patient. Patient is a 62 year old female history of hypertension who presents with nosebleed. Agency she began having nosebleed around 3 PM earlier today. Bleeding has been fairly persistent. She has tried ice over the area and pressure and packing with gauze only to have temporary slowing or momentary stopping of the blood. She also continues to feel blood draining to the back of the throat. She denies having any lightheadedness, dizziness, near syncope or shortness of breath. She denies having similar symptoms previously. Patient does report a long history of daily store over-the-counter nasal spray use. Pt denies cocaine use.     Past Medical History  Diagnosis Date  . Acid reflux   . Osteoporosis   . Hypertension   . Stomach ulcer   . Seizures   . Anxiety     Past Surgical History  Procedure Laterality Date  . Stomach surgery    . Eye surgery    . Wrist surgery      No family history on file.  History  Substance Use Topics  . Smoking status: Never Smoker   . Smokeless tobacco: Not on file  . Alcohol Use: No    OB History   Grav Para Term Preterm Abortions TAB SAB Ect Mult Living                  Review of Systems  HENT: Positive for nosebleeds. Negative for sore throat.   Gastrointestinal: Negative for nausea and vomiting.  All other systems reviewed and are negative.    Allergies  Codeine; Other; Shellfish allergy; and Toradol  Home Medications   Current Outpatient Rx  Name  Route  Sig  Dispense  Refill  . Alpha-D-Galactosidase (BEANO PO)   Oral   Take 1 tablet by mouth daily as needed (For flatulence).         Marland Kitchen amLODipine (NORVASC) 5 MG tablet   Oral   Take 2 tablets (10 mg total) by mouth daily.         . ASA-APAP-Caff Buffered  (VANQUISH) [phone] MG TABS   Oral   Take 1 tablet by mouth daily as needed (For headache.).         Marland Kitchen Biotin 5 MG CAPS   Oral   Take 1 capsule by mouth daily.         . Calcium Carbonate-Vit D-Min (CALCIUM 1200 PO)   Oral   Take 1 capsule by mouth daily.         . Cholecalciferol (VITAMIN D3) 5000 UNITS CAPS   Oral   Take 1 capsule by mouth daily.         . clonazePAM (KLONOPIN) 0.25 MG disintegrating tablet   Oral   Take 1 tablet (0.25 mg total) by mouth 3 (three) times daily as needed.   30 tablet   0   . Coenzyme Q10 (COQ10) 100 MG CAPS   Oral   Take 1 capsule by mouth daily.         . Coenzyme Q10 200 MG capsule   Oral   Take 200 mg by mouth daily.         Marland Kitchen docusate sodium (COLACE) 100 MG capsule   Oral   Take 100 mg by  mouth 2 (two) times daily.         . fluticasone (FLONASE) 50 MCG/ACT nasal spray   Nasal   Place 1 spray into the nose 2 (two) times daily at 10 AM and 5 PM.   16 g   1   . hydrochlorothiazide (HYDRODIURIL) 25 MG tablet   Oral   Take 1 tablet (25 mg total) by mouth daily.   30 tablet   0   . Magnesium 500 MG TABS   Oral   Take 1 tablet by mouth daily.         Marland Kitchen MAGNESIUM PO   Oral   Take 1 tablet by mouth daily.         . meclizine (ANTIVERT) 25 MG tablet   Oral   Take 25 mg by mouth 3 (three) times daily as needed.         . Multiple Vitamin (MULTI-VITAMIN PO)   Oral   Take 1 tablet by mouth daily.         Marland Kitchen omeprazole (PRILOSEC) 20 MG capsule   Oral   Take 20 mg by mouth daily.          . potassium chloride SA (K-DUR,KLOR-CON) 20 MEQ tablet   Oral   Take 2 tablets (40 mEq total) by mouth daily.   30 tablet   0   . pravastatin (PRAVACHOL) 40 MG tablet   Oral   Take 60 mg by mouth daily.         . promethazine (PHENERGAN) 25 MG tablet   Oral   Take 25 mg by mouth every 6 (six) hours as needed for nausea.         . ranitidine (ZANTAC) 150 MG tablet   Oral   Take 150 mg by mouth 2  (two) times daily.         Marland Kitchen senna-docusate (SENOKOT-S) 8.6-50 MG per tablet   Oral   Take 2 tablets by mouth daily as needed for constipation.         . sucralfate (CARAFATE) 1 G tablet   Oral   Take 1 g by mouth 4 (four) times daily.         Marland Kitchen topiramate (TOPAMAX) 100 MG tablet   Oral   Take 100 mg by mouth 5 (five) times daily.            BP 138/91  Pulse 89  Temp(Src) 96.6 F (35.9 C) (Axillary)  Resp 16  SpO2 98%  Physical Exam  Nursing note and vitals reviewed. Constitutional: She is oriented to person, place, and time. She appears well-developed and well-nourished. No distress.  HENT:  Head: Normocephalic and atraumatic.  Mouth/Throat: Oropharynx is clear and moist.  Perforated septum. There are several blood clots and bleeding appears to be from the left nostril area or perhaps the floor of the septal area.  Cardiovascular: Normal rate and regular rhythm.   No murmur heard. Pulmonary/Chest: Effort normal and breath sounds normal. No respiratory distress. She has no wheezes.  Neurological: She is alert and oriented to person, place, and time.  Skin: Skin is warm and dry. No rash noted.  Psychiatric: She has a normal mood and affect. Her behavior is normal.    ED Course  EPISTAXIS MANAGEMENT Date/Time: 09/20/2012 10:30 AM Performed by: Angus Seller Authorized by: Angus Seller Consent: Verbal consent obtained. Risks and benefits: risks, benefits and alternatives were discussed Consent given by: patient Patient understanding: patient states understanding of  the procedure being performed Patient consent: the patient's understanding of the procedure matches consent given Patient identity confirmed: verbally with patient Time out: Immediately prior to procedure a "time out" was called to verify the correct patient, procedure, equipment, support staff and site/side marked as required. Local anesthetic: lidocaine 1% with epinephrine Treatment site:  Septum. Repair method: suction and silver nitrate Post-procedure assessment: bleeding stopped Treatment complexity: complex Patient tolerance: Patient tolerated the procedure well with no immediate complications.     Results for orders placed during the hospital encounter of 09/20/12  CBC      Result Value Range   WBC 9.0  4.0 - 10.5 K/uL   RBC 4.27  3.87 - 5.11 MIL/uL   Hemoglobin 13.5  12.0 - 15.0 g/dL   HCT 16.1  09.6 - 04.5 %   MCV 89.2  78.0 - 100.0 fL   MCH 31.6  26.0 - 34.0 pg   MCHC 35.4  30.0 - 36.0 g/dL   RDW 40.9  81.1 - 91.4 %   Platelets 266  150 - 400 K/uL       1. Epistaxis   2. Nasal septal perforation       MDM  8:50 PM patient seen and evaluated. Patient appears well in no acute distress. Does have active nose bleeding.  Patient seen and evaluated with attending physician. Patient was complex epistaxis given perforated septum. Bleeding was finally controlled after multiple attempts with packing and cauterization. Patient given referral to follow up with ENT     Angus Seller, PA-C 09/21/12 [phone]

## 2012-09-20 NOTE — ED Notes (Signed)
Pt arrived from the [facility] via EMS with a chief complaint of epistaxis.  Pt states she is at the rehabilitation center for a broke wrist recovery as well as being very weak and non mobile.  Nose bleed began @ 1500 hrs with some stops during the day.  Pt states she feels as if she has "swallowed a quart of blood."

## 2012-09-22 NOTE — ED Provider Notes (Signed)
Pt with nose bleed today - on exam she has a large nasal septal defect - after I packed her nose with lidocaine soaked cotton balls, I was able to detect that the bleed was coming form the septum in the ring defecit and this was treated by myself with assistance of Mr. Orson Slick with silver nitrate cautery.  She tolerated this reasonably well, was observed for a time and released with hemodynamic control and stability.  She was encouraged to follow up with ENT.  Medical screening examination/treatment/procedure(s) were conducted as a shared visit with non-physician practitioner(s) and myself.  I personally evaluated the patient during the encounter    Vida Roller, MD 09/22/12 [phone]

## 2013-02-01 IMAGING — CR DG CHEST 2V
2 series · 2 of 2 positions shown · non-contrast
Comparison: None.

CLINICAL DATA: Short of breath

CHEST - 2 VIEW

[w chest pa]
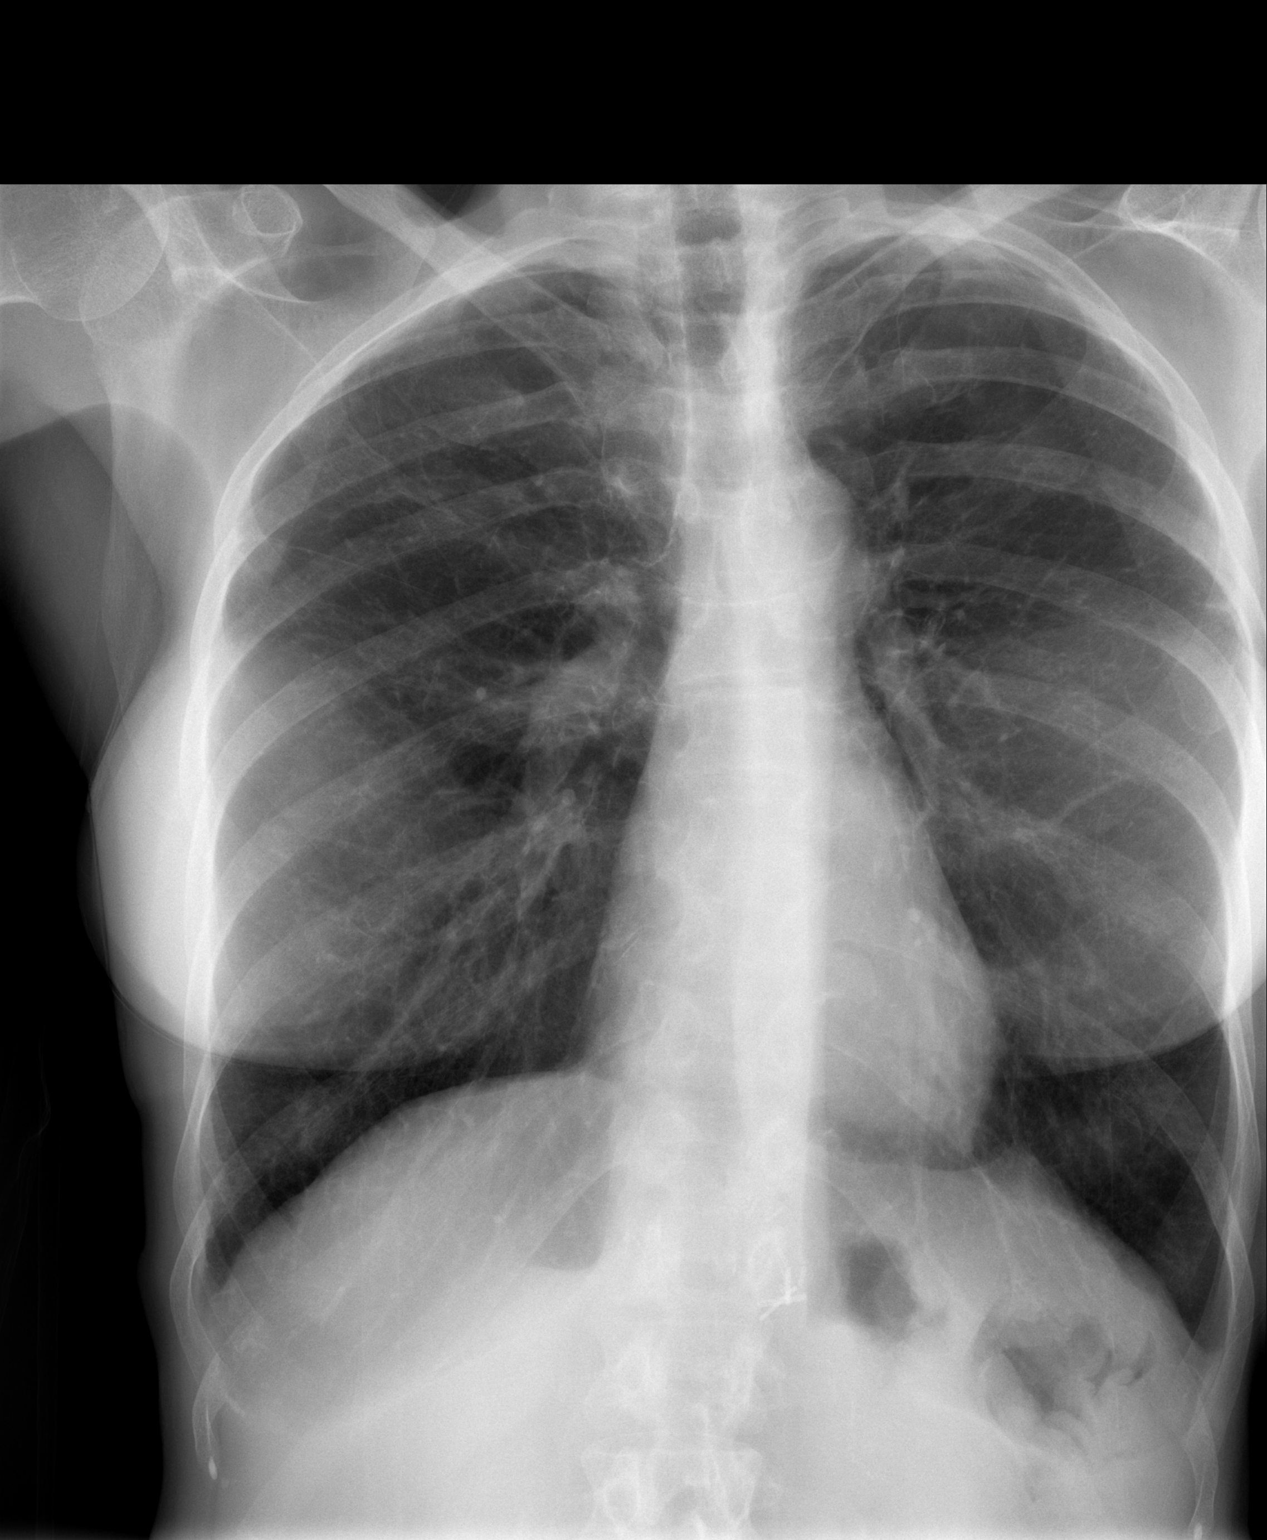

[w chest lat]
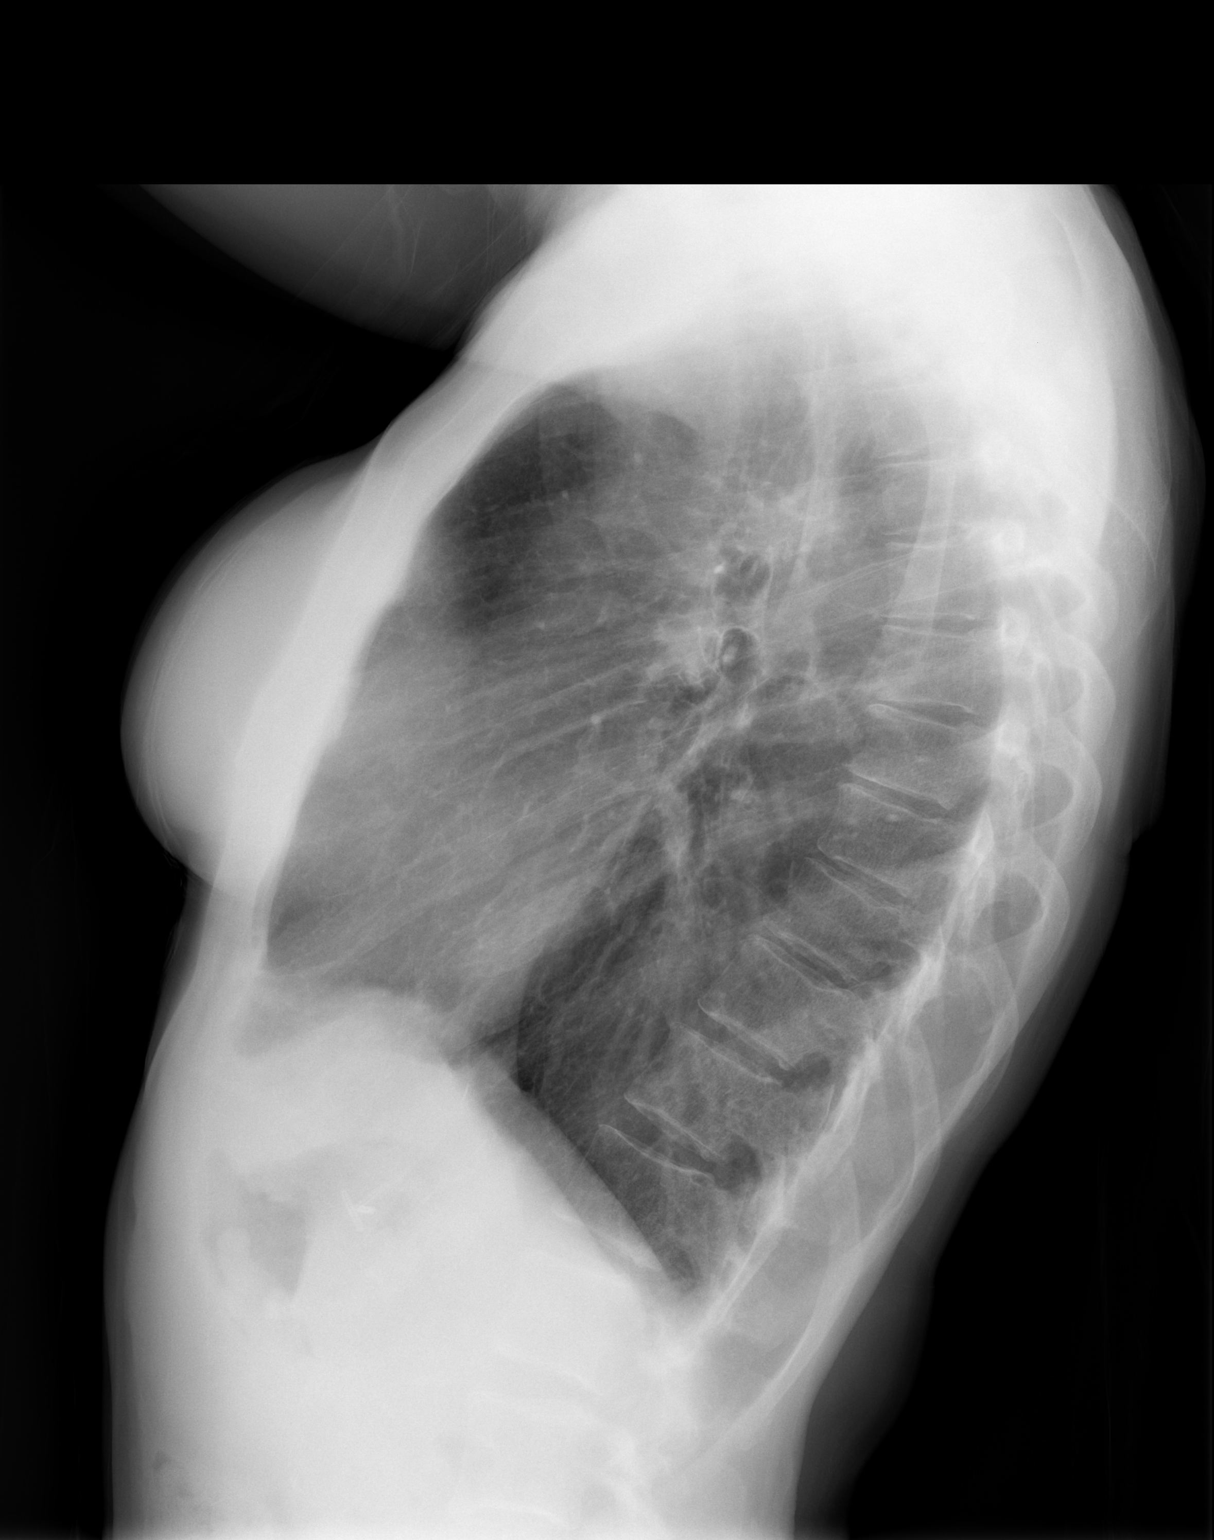

[2 of 2 positions shown; findings below may reference images not displayed]

FINDINGS: Lungs are hyperaerated with bronchitic changes and
interstitial prominence.    Pneumothorax or pleural effusion.  No
consolidation or mass.  Normal heart size.
IMPRESSION: Chronic changes.  No active cardiopulmonary disease.

## 2013-10-26 ENCOUNTER — Ambulatory Visit: Payer: Self-pay | Admitting: Podiatry

## 2013-11-12 ENCOUNTER — Telehealth: Payer: Self-pay | Admitting: *Deleted

## 2013-11-12 NOTE — Telephone Encounter (Signed)
I probably have the wrong line.  I called the patient.  She stated she had the wrong line, just needed directions.  Called back and got it.  Sorry to disturb you.

## 2013-11-16 ENCOUNTER — Ambulatory Visit (INDEPENDENT_AMBULATORY_CARE_PROVIDER_SITE_OTHER): Payer: Medicare Other | Admitting: Podiatry

## 2013-11-16 ENCOUNTER — Ambulatory Visit (INDEPENDENT_AMBULATORY_CARE_PROVIDER_SITE_OTHER): Payer: Medicare Other

## 2013-11-16 ENCOUNTER — Encounter: Payer: Self-pay | Admitting: Podiatry

## 2013-11-16 VITALS — BP 150/89 | HR 59 | Resp 14 | Ht 64.0 in | Wt 97.0 lb

## 2013-11-16 DIAGNOSIS — M2040 Other hammer toe(s) (acquired), unspecified foot: Secondary | ICD-10-CM

## 2013-11-16 DIAGNOSIS — M722 Plantar fascial fibromatosis: Secondary | ICD-10-CM

## 2013-11-16 DIAGNOSIS — L600 Ingrowing nail: Secondary | ICD-10-CM

## 2013-11-16 MED ORDER — TRIAMCINOLONE ACETONIDE 10 MG/ML IJ SUSP: 10.0000 mg | Freq: Once | INTRAMUSCULAR | Status: DC

## 2013-11-16 NOTE — Progress Notes (Signed)
   Subjective:    Patient ID: Tina GuarneriSandra J Mann, female    DOB: 1950/05/01, 63 y.o.   MRN: [id number]  HPI Comments: N foot pain L B/L arch to the toes D 6 - 7 months O C shooting pain A short period of weight bearing T predinsone  Pt states her hammer toes are worsening.     Review of Systems  Eyes: Positive for redness, itching and visual disturbance.  Endocrine: Positive for cold intolerance and polydipsia.  Musculoskeletal: Positive for gait problem.  Neurological: Positive for seizures and headaches.  Hematological: Bruises/bleeds easily.       Slow to heal  Psychiatric/Behavioral: The patient is nervous/anxious.   All other systems reviewed and are negative.      Objective:   Physical Exam        Assessment & Plan:

## 2013-11-18 NOTE — Progress Notes (Signed)
Subjective:     Patient ID: [name], female   DOB: [birth year], 63 y.o.   MRN: [id number]  HPI patient presents with several problems of which it was difficult to get complete understanding. She appears to have discomfort in her plantar fascial left over right with discomfort that radiates into the arch and has significant structural deformity of the fourth digit left over right and incurvated nail bed of the hallux bilateral that she gets taken care of by pedicurist but is not able to get completely resolved   Review of Systems  All other systems reviewed and are negative.      Objective:   Physical Exam  Nursing note and vitals reviewed. Constitutional: She is oriented to person, place, and time.  Cardiovascular: Intact distal pulses.   Musculoskeletal: Normal range of motion.  Neurological: She is oriented to person, place, and time.  Skin: Skin is warm.   neurovascular status is intact with muscle strength adequate and range of motion subtalar midtarsal joint within normal limits. Found to have a relatively narrow foot with some diminishment of the arch and is noted to have good digital perfusion of both feet teary at patient is found to have discomfort but mostly in the plantar left heel with moderate into the arch and has severe distal deformity of the fourth toe left over right with distal lateral keratotic lesion that becomes painful when she walks. 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 Discussed all conditions and today I am focusing on the heel. When I injected her she has extreme movement and is unable to get the full dosage of medicine and but I did get a small amount in and applied the  boot in order to try to stretch the plantar fascia and gave instructions on of eventual digital correction and fourth toes along with probable ingrown toenail correction which would be done at the surgical center

## 2013-12-07 ENCOUNTER — Encounter: Payer: Self-pay | Admitting: Podiatry

## 2013-12-07 ENCOUNTER — Ambulatory Visit (INDEPENDENT_AMBULATORY_CARE_PROVIDER_SITE_OTHER): Payer: Medicare Other | Admitting: Podiatry

## 2013-12-07 VITALS — BP 120/78 | HR 74 | Resp 17

## 2013-12-07 DIAGNOSIS — M2040 Other hammer toe(s) (acquired), unspecified foot: Secondary | ICD-10-CM

## 2013-12-07 DIAGNOSIS — M722 Plantar fascial fibromatosis: Secondary | ICD-10-CM

## 2013-12-07 NOTE — Progress Notes (Signed)
   Subjective:    Patient ID: Tina GuarneriSandra J Mann, female    DOB: January 24, 1951, 63 y.o.   MRN: [id number]  HPI Pt  Presents for f/u bilateral foot pain from plantar fasciitis, states that both feet still hurt   Review of Systems     Objective:   Physical Exam        Assessment & Plan:

## 2013-12-07 NOTE — Progress Notes (Signed)
Subjective:     Patient ID: Tina GuarneriSandra J Mann, female   DOB: 19-Jun-1950, 63 y.o.   MRN: [id number]  HPI patient presents stating that she was doing real well but she was active over the weekend and she feels like she needs another boot and she cannot wear both of them to bed and it seems to help her a lot when she is wearing the night splint. Patient states the archs have been hurting   Review of Systems     Objective:   Physical Exam Neurovascular status intact with muscle strength adequate and noted to have discomfort in the medial band of the plantar fascia in the distal one third of both feet with pain upon palpation. Patient was improving utilizing the night splint but has been unable to use it as we had indicate    Assessment:     Patient is making progress but does still have discomfort in the medial fascially band bilateral do to long-term nature of her problem and also continues to have problems with the fourth toe left over right    Plan:     Reviewed condition and dispensed second night splint so she can wear both of them to bed. I then went ahead and scanned for customized orthotic devices and gave instructions on physical therapy

## 2013-12-21 ENCOUNTER — Telehealth: Payer: Self-pay | Admitting: *Deleted

## 2013-12-21 NOTE — Telephone Encounter (Signed)
Pt called left no message.  I called the pt, she asked if her orthotics are in.  I told her they were and I would have the schedulers call to set up an appt.  Pt states she will call early tomorrow.

## 2013-12-22 NOTE — Telephone Encounter (Signed)
Patient already has an appointment scheduled on 9.1.2015

## 2013-12-25 ENCOUNTER — Ambulatory Visit: Payer: Medicare Other

## 2013-12-25 DIAGNOSIS — M722 Plantar fascial fibromatosis: Secondary | ICD-10-CM

## 2013-12-25 NOTE — Patient Instructions (Signed)

## 2013-12-25 NOTE — Progress Notes (Signed)
This pt is here to puo

## 2013-12-29 ENCOUNTER — Other Ambulatory Visit: Payer: Medicare Other

## 2014-01-01 ENCOUNTER — Other Ambulatory Visit: Payer: Medicare Other

## 2014-07-03 IMAGING — CR DG CHEST 2V
2 series · 2 of 2 positions shown · non-contrast
Comparison: Chest radiograph [DATE]

CLINICAL DATA: Weakness and confusion

CHEST - 2 VIEW

[w chest pa]
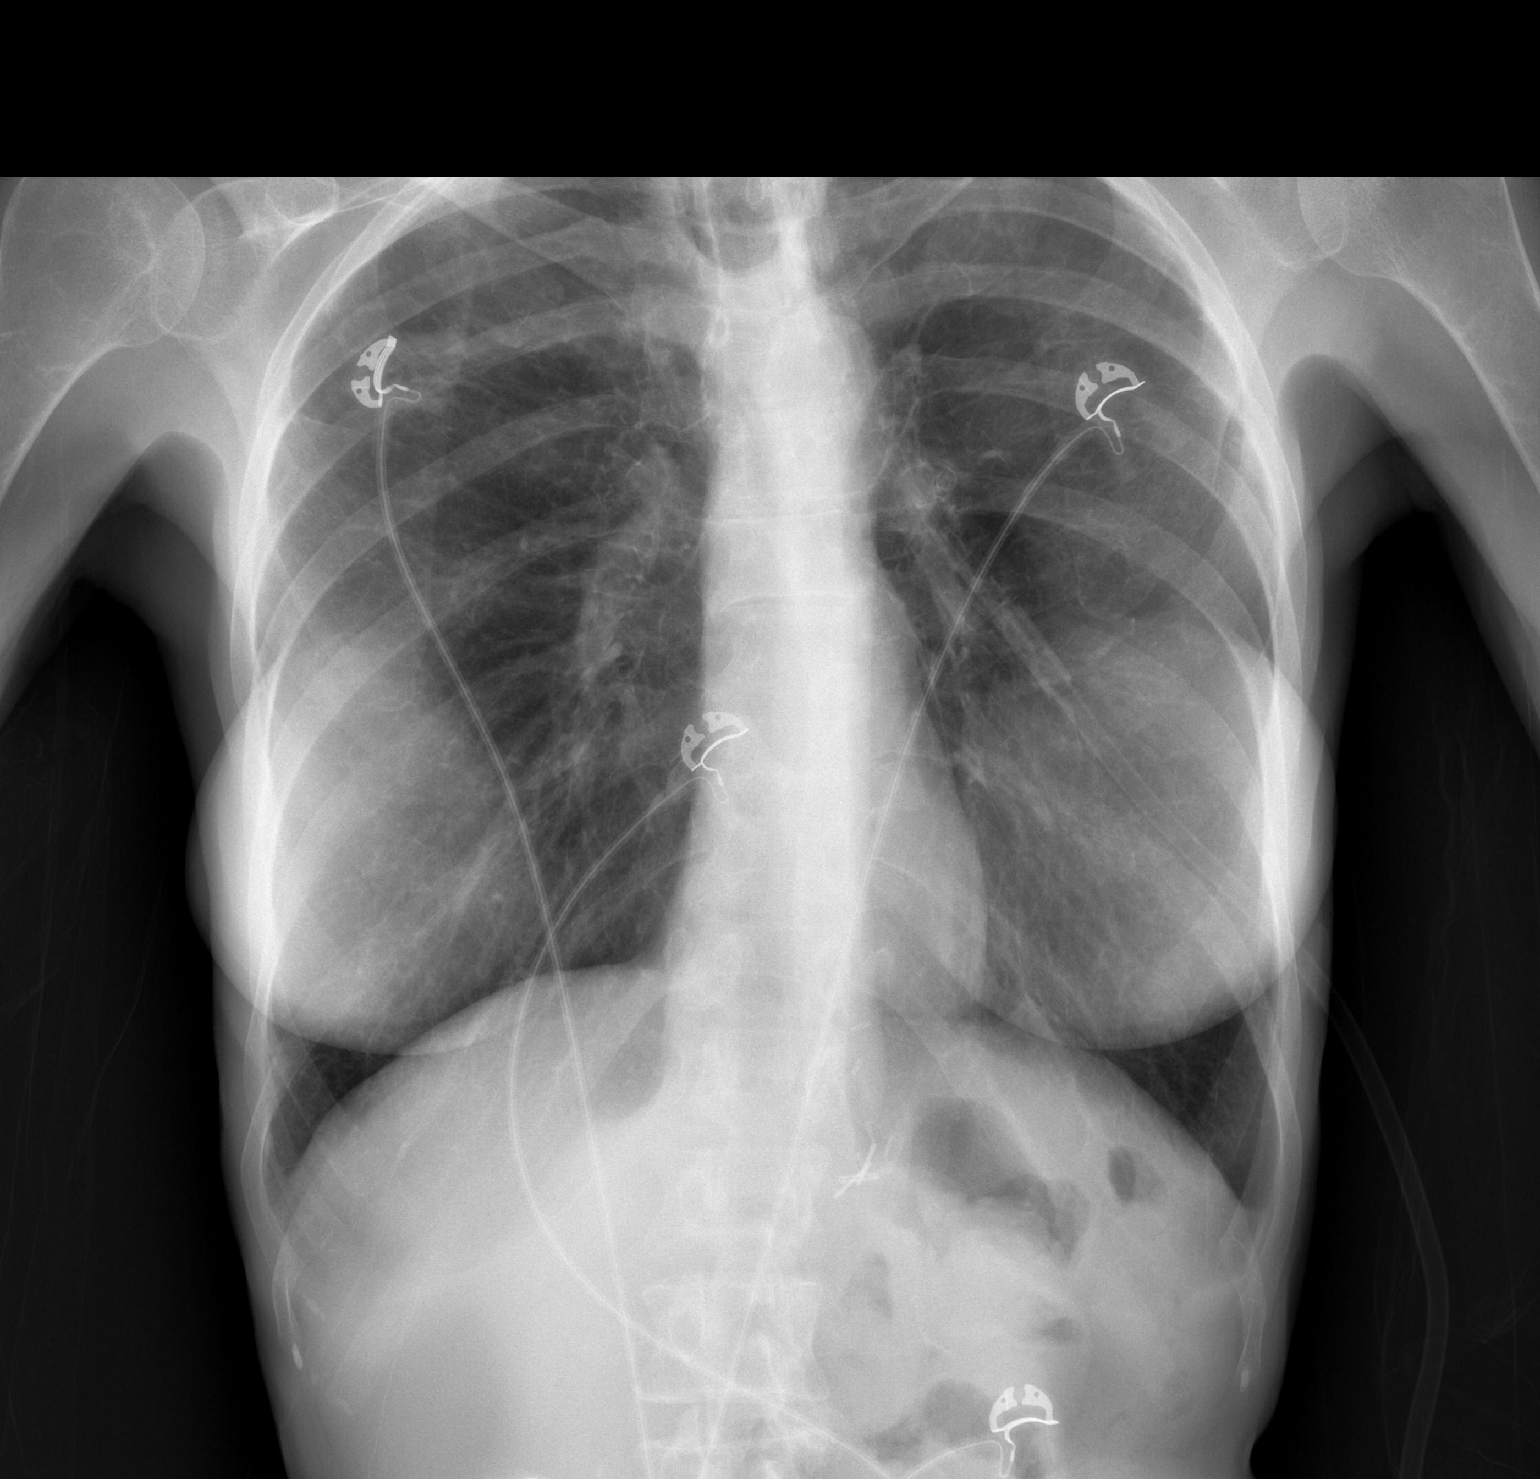

[w chest lat]
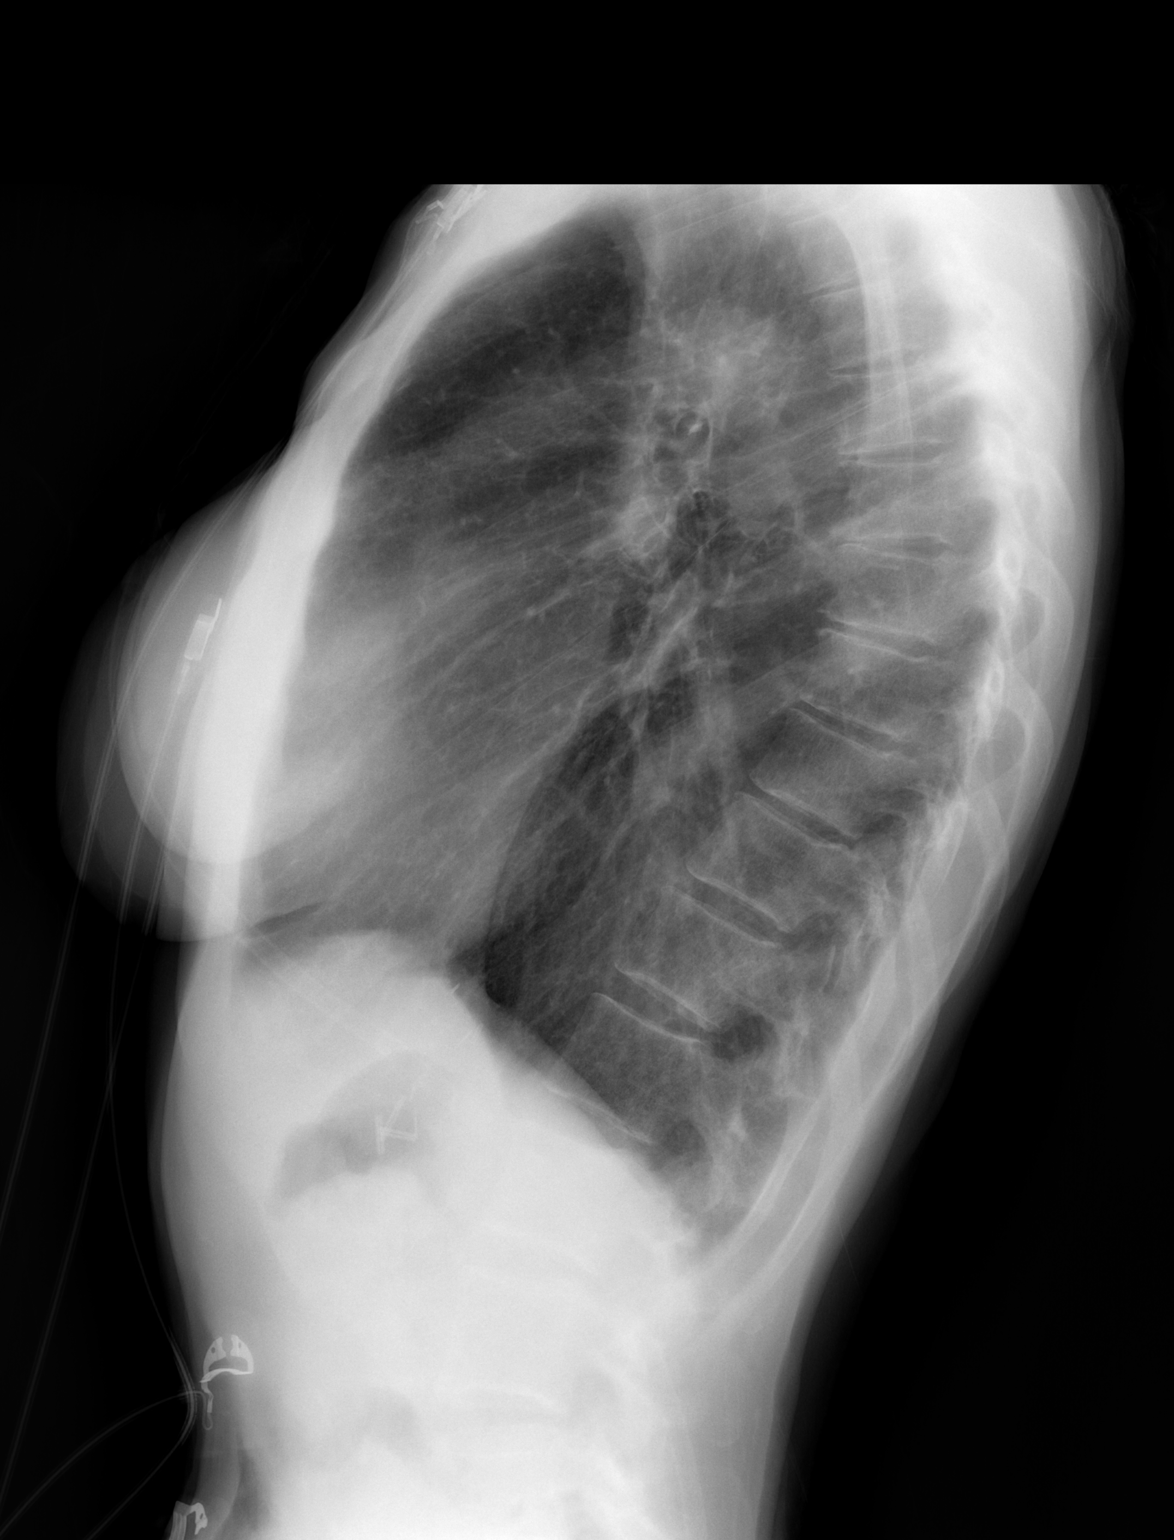

[2 of 2 positions shown; findings below may reference images not displayed]

FINDINGS: Normal cardiac silhouette.  Lungs are hyperinflated.  No
effusion, infiltrate, pneumothorax.  No aggressive osseous lesions.
IMPRESSION: Hyperinflated lungs without acute cardiopulmonary findings.

## 2016-04-20 ENCOUNTER — Emergency Department (HOSPITAL_BASED_OUTPATIENT_CLINIC_OR_DEPARTMENT_OTHER): Payer: Medicare Other

## 2016-04-20 ENCOUNTER — Emergency Department (HOSPITAL_BASED_OUTPATIENT_CLINIC_OR_DEPARTMENT_OTHER)
Admission: EM | Admit: 2016-04-20 | Discharge: 2016-04-20 | Disposition: A | Discharge status: Other Acute Inpatient Hospital | Payer: Medicare Other | Attending: Emergency Medicine | Admitting: Emergency Medicine

## 2016-04-20 ENCOUNTER — Encounter (HOSPITAL_BASED_OUTPATIENT_CLINIC_OR_DEPARTMENT_OTHER): Payer: Self-pay | Admitting: *Deleted

## 2016-04-20 DIAGNOSIS — R1010 Upper abdominal pain, unspecified: Secondary | ICD-10-CM | POA: Diagnosis present

## 2016-04-20 DIAGNOSIS — Z79899 Other long term (current) drug therapy: Secondary | ICD-10-CM | POA: Diagnosis not present

## 2016-04-20 DIAGNOSIS — I10 Essential (primary) hypertension: Secondary | ICD-10-CM | POA: Insufficient documentation

## 2016-04-20 DIAGNOSIS — K851 Biliary acute pancreatitis without necrosis or infection: Secondary | ICD-10-CM | POA: Diagnosis not present

## 2016-04-20 LAB — CBC WITH DIFFERENTIAL/PLATELET
Basophils Absolute: 0 10*3/uL (ref 0.0–0.1)
Basophils Relative: 0 %
EOS PCT: 0 %
Eosinophils Absolute: 0 10*3/uL (ref 0.0–0.7)
HCT: 35.9 % — ABNORMAL LOW (ref 36.0–46.0)
HEMOGLOBIN: 12 g/dL (ref 12.0–15.0)
LYMPHS ABS: 0.3 10*3/uL — AB (ref 0.7–4.0)
LYMPHS PCT: 3 %
MCH: 28.2 pg (ref 26.0–34.0)
MCHC: 33.4 g/dL (ref 30.0–36.0)
MCV: 84.3 fL (ref 78.0–100.0)
Monocytes Absolute: 0.5 10*3/uL (ref 0.1–1.0)
Monocytes Relative: 5 %
Neutro Abs: 9.6 10*3/uL — ABNORMAL HIGH (ref 1.7–7.7)
Neutrophils Relative %: 92 %
PLATELETS: 219 10*3/uL (ref 150–400)
RBC: 4.26 MIL/uL (ref 3.87–5.11)
RDW: 15 % (ref 11.5–15.5)
WBC: 10.4 10*3/uL (ref 4.0–10.5)

## 2016-04-20 LAB — COMPREHENSIVE METABOLIC PANEL
ALK PHOS: 503 U/L — AB (ref 38–126)
ALT: 604 U/L — AB (ref 14–54)
AST: 492 U/L — ABNORMAL HIGH (ref 15–41)
Albumin: 4.2 g/dL (ref 3.5–5.0)
Anion gap: 15 (ref 5–15)
BILIRUBIN TOTAL: 2.3 mg/dL — AB (ref 0.3–1.2)
BUN: 19 mg/dL (ref 6–20)
CALCIUM: 9.5 mg/dL (ref 8.9–10.3)
CO2: 24 mmol/L (ref 22–32)
CREATININE: 0.64 mg/dL (ref 0.44–1.00)
Chloride: 90 mmol/L — ABNORMAL LOW (ref 101–111)
Glucose, Bld: 160 mg/dL — ABNORMAL HIGH (ref 65–99)
Potassium: 2.4 mmol/L — CL (ref 3.5–5.1)
Sodium: 129 mmol/L — ABNORMAL LOW (ref 135–145)
TOTAL PROTEIN: 7.7 g/dL (ref 6.5–8.1)

## 2016-04-20 LAB — LIPASE, BLOOD: LIPASE: 346 U/L — AB (ref 11–51)

## 2016-04-20 MED ORDER — POTASSIUM CHLORIDE CRYS ER 20 MEQ PO TBCR
40.0000 meq | EXTENDED_RELEASE_TABLET | Freq: Once | ORAL | Status: AC
  Administered 2016-04-20: 40 meq via ORAL
  Filled 2016-04-20: qty 2

## 2016-04-20 MED ORDER — ONDANSETRON HCL 4 MG/2ML IJ SOLN
4.0000 mg | Freq: Once | INTRAMUSCULAR | Status: AC
  Administered 2016-04-20: 4 mg via INTRAVENOUS
  Filled 2016-04-20: qty 2

## 2016-04-20 MED ORDER — DEXTROSE 5 % IV SOLN
2.0000 g | Freq: Once | INTRAVENOUS | Status: AC
  Administered 2016-04-20: 2 g via INTRAVENOUS
  Filled 2016-04-20: qty 2

## 2016-04-20 MED ORDER — SODIUM CHLORIDE 0.9 % IV BOLUS (SEPSIS)
1000.0000 mL | Freq: Once | INTRAVENOUS | Status: AC
  Administered 2016-04-20: 1000 mL via INTRAVENOUS

## 2016-04-20 MED ORDER — PROMETHAZINE HCL 25 MG/ML IJ SOLN
12.5000 mg | Freq: Once | INTRAMUSCULAR | Status: AC
  Administered 2016-04-20: 12.5 mg via INTRAVENOUS
  Filled 2016-04-20: qty 1

## 2016-04-20 MED ORDER — HYDROMORPHONE HCL 1 MG/ML IJ SOLN
1.0000 mg | Freq: Once | INTRAMUSCULAR | Status: AC
  Administered 2016-04-20: 1 mg via INTRAVENOUS
  Filled 2016-04-20: qty 1

## 2016-04-20 MED ORDER — FAMOTIDINE IN NACL 20-0.9 MG/50ML-% IV SOLN
20.0000 mg | Freq: Once | INTRAVENOUS | Status: AC
  Administered 2016-04-20: 20 mg via INTRAVENOUS
  Filled 2016-04-20: qty 50

## 2016-04-20 MED ORDER — POTASSIUM CHLORIDE 10 MEQ/100ML IV SOLN
10.0000 meq | INTRAVENOUS | Status: AC
  Administered 2016-04-20 (×3): 10 meq via INTRAVENOUS
  Filled 2016-04-20 (×3): qty 100

## 2016-04-20 NOTE — ED Notes (Signed)
ED Provider at bedside. 

## 2016-04-20 NOTE — ED Notes (Signed)
Pt in US at this time 

## 2016-04-20 NOTE — ED Triage Notes (Addendum)
Pt c/o mid abd pain with n/v x 6 hrs , hx ulcers. Pt states she takes vicodin for abd pain , pt denies taking any today  Pts pupils are pinpoint and pt is demanding to be taking to a room, pt informed of wait and aquity levels

## 2016-04-20 NOTE — ED Provider Notes (Signed)
[facility]   CSN: [id number] Arrival date & time: 04/20/16  1411     History   Chief Complaint Chief Complaint  Patient presents with  . Abdominal Pain    HPI [name] is a 65 y.o. female.  HPI  65 year old female presents with severe upper abdominal pain. Patient states it started this morning. She states it feels like when she's had prior pain from ulcers. She states that has happened to her about 4 times. No chest pain or shortness of breath. Has had nausea and vomiting. Given IV Zofran prior to my arrival but states this has not helped and is continuing to vomit. She has not noticed any hematemesis. Had one bowel movement this morning but it was normal. Has not noticed hematochezia or melena. Tried Tums this morning without relief. No urinary symptoms.  Past Medical History:  Diagnosis Date  . Acid reflux   . Anxiety   . Hypertension   . Osteoporosis   . Seizures (HCC)   . Stomach ulcer     Patient Active Problem List   Diagnosis Date Noted  . Hypertensive urgency 09/14/2012  . Polypharmacy 09/14/2012  . Anxiety 09/14/2012  . GERD (gastroesophageal reflux disease) 09/14/2012  . Seizure (HCC) 09/14/2012    Past Surgical History:  Procedure Laterality Date  . EYE SURGERY    . STOMACH SURGERY    . WRIST SURGERY      OB History    No data available       Home Medications    Prior to Admission medications   Medication Sig Start Date End Date Taking? Authorizing Provider  HYDROcodone-acetaminophen (NORCO/VICODIN) 5-325 MG tablet Take 1 tablet by mouth every 6 (six) hours as needed for moderate pain.   Yes Historical Provider, MD  amLODipine (NORVASC) 5 MG tablet Take 2 tablets (10 mg total) by mouth daily. 09/17/12   Zannie Cove, MD  Biotin 5 MG CAPS Take 1 capsule by mouth daily.    Historical Provider, MD  Calcium Carbonate-Vit D-Min (CALCIUM 1200 PO) Take 1 capsule by mouth daily.    Historical Provider, MD    Cholecalciferol (VITAMIN D3) 5000 UNITS CAPS Take 1 capsule by mouth daily.    Historical Provider, MD  clonazePAM (KLONOPIN) 0.25 MG disintegrating tablet Take 0.25 mg by mouth 3 (three) times daily as needed. For seizure & anxiety    Historical Provider, MD  clonazePAM (KLONOPIN) 0.5 MG tablet Take 0.5 mg by mouth at bedtime. scheduled    Historical Provider, MD  Coenzyme Q10 200 MG capsule Take 200 mg by mouth daily.    Historical Provider, MD  Denosumab (PROLIA [place]) Inject into the skin.    Historical Provider, MD  Magnesium 500 MG TABS Take 1 tablet by mouth daily.    Historical Provider, MD  Multiple Vitamin (MULTI-VITAMIN PO) Take 1 tablet by mouth daily.    Historical Provider, MD  potassium chloride SA (K-DUR,KLOR-CON) 20 MEQ tablet Take 2 tablets (40 mEq total) by mouth daily. 09/17/12   Zannie Cove, MD  pravastatin (PRAVACHOL) 40 MG tablet Take 60 mg by mouth daily.    Historical Provider, MD  sucralfate (CARAFATE) 1 G tablet Take 1 g by mouth 4 (four) times daily.    Historical Provider, MD  topiramate (TOPAMAX) 100 MG tablet Take 100 mg by mouth 5 (five) times daily.     Historical Provider, MD    Family History No family history on file.  Social History Social History  Substance Use Topics  . Smoking status: Never Smoker  . Smokeless tobacco: Not on file  . Alcohol use No     Allergies   Codeine; Other; Shellfish allergy; Toradol [ketorolac tromethamine]; and Tramadol hcl   Review of Systems Review of Systems  Constitutional: Negative for fever.  Respiratory: Negative for shortness of breath.   Cardiovascular: Negative for chest pain.  Gastrointestinal: Positive for abdominal pain, nausea and vomiting. Negative for blood in stool, constipation and diarrhea.  Genitourinary: Negative for dysuria and hematuria.  All other systems reviewed and are negative.    Physical Exam Updated Vital Signs BP 119/71   Pulse 77   Temp 97.6 F (36.4 C) (Oral)   Resp 13    Ht 5\' 3"  (1.6 m)   Wt 95 lb (43.1 kg)   SpO2 97%   BMI 16.83 kg/m   Physical Exam  Constitutional: She is oriented to person, place, and time. She appears well-developed and well-nourished.  Unable to lay on back due to pain, will only roll on left side  HENT:  Head: Normocephalic and atraumatic.  Right Ear: External ear normal.  Left Ear: External ear normal.  Nose: Nose normal.  Eyes: Right eye exhibits no discharge. Left eye exhibits no discharge.  Cardiovascular: Normal rate, regular rhythm and normal heart sounds.   Pulmonary/Chest: Effort normal and breath sounds normal.  Abdominal: Soft. There is tenderness in the right upper quadrant, right lower quadrant and epigastric area. There is guarding. There is no rigidity.  Neurological: She is alert and oriented to person, place, and time.  Skin: Skin is warm and dry.  Nursing note and vitals reviewed.    ED Treatments / Results  Labs (all labs ordered are listed, but only abnormal results are displayed) Labs Reviewed  CBC WITH DIFFERENTIAL/PLATELET - Abnormal; Notable for the following:       Result Value   HCT 35.9 (*)    Neutro Abs 9.6 (*)    Lymphs Abs 0.3 (*)    All other components within normal limits  COMPREHENSIVE METABOLIC PANEL - Abnormal; Notable for the following:    Sodium 129 (*)    Potassium 2.4 (*)    Chloride 90 (*)    Glucose, Bld 160 (*)    AST 492 (*)    ALT 604 (*)    Alkaline Phosphatase 503 (*)    Total Bilirubin 2.3 (*)    All other components within normal limits  LIPASE, BLOOD - Abnormal; Notable for the following:    Lipase 346 (*)    All other components within normal limits    EKG  EKG Interpretation None       Radiology [place] Abdomen Limited Ruq  Result Date: 04/20/2016 CLINICAL DATA:  Severe epigastric pain that began this morning, nausea and vomiting today, history of gastric ulcers EXAM: US ABDOMEN LIMITED - RIGHT UPPER QUADRANT COMPARISON:  None FINDINGS: Gallbladder:  Dependent shadowing gallstones in gallbladder largest 16 mm diameter. Distended gallbladder. Mild gallbladder wall thickening/striation and pericholecystic fluid. No sonographic Eulah PontMurphy sign identified though the patient previously received pain medication. Findings suspicious for acute cholecystitis. Common bile duct: Diameter:  7 mm diameter, upper normal for age Liver: Minimal central intrahepatic biliary dilatation. No focal hepatic lesion. Upper normal hepatic echogenicity. Small amount of RIGHT upper quadrant free fluid. IMPRESSION: Distended gallbladder with gallbladder wall thickening and gallstones as well as pericholecystic edema aspirations/edema within gallbladder wall. Findings suspicious for acute cholecystitis. Upper normal caliber CBD with minimal central  intrahepatic biliary dilatation, recommend correlation with LFTs. Electronically Signed   By: Ulyses SouthwardMark  Boles M.D.   On: 04/20/2016 17:51    Procedures Procedures (including critical care time)  Medications Ordered in ED Medications  sodium chloride 0.9 % bolus 1,000 mL (0 mLs Intravenous Stopped 04/20/16 1654)  ondansetron (ZOFRAN) injection 4 mg (4 mg Intravenous Given 04/20/16 1600)  sodium chloride 0.9 % bolus 1,000 mL (0 mLs Intravenous Stopped 04/20/16 1837)  HYDROmorphone (DILAUDID) injection 1 mg (1 mg Intravenous Given 04/20/16 1637)  promethazine (PHENERGAN) injection 12.5 mg (12.5 mg Intravenous Given 04/20/16 1645)  famotidine (PEPCID) IVPB 20 mg premix (0 mg Intravenous Stopped 04/20/16 1721)  potassium chloride 10 mEq in 100 mL IVPB (10 mEq Intravenous Transfusing/Transfer 04/20/16 1947)  potassium chloride SA (K-DUR,KLOR-CON) CR tablet 40 mEq (40 mEq Oral Given 04/20/16 1746)  cefTRIAXone (ROCEPHIN) 2 g in dextrose 5 % 50 mL IVPB (0 g Intravenous Stopped 04/20/16 1858)     Initial Impression / Assessment and Plan / ED Course  I have reviewed the triage vital signs and the nursing notes.  Pertinent labs & imaging  results that were available during my care of the patient were reviewed by me and considered in my medical decision making (see chart for details).  Clinical Course as of Apr 21 54  Fri Apr 20, 2016  1625 Will give IV dilaudid, phenergan, and get RUQ u/s given LFTs and upper abd pain.   [SG]  1818 [facility] for acute cholecystitis.  [SG]  1830 [facility]al[facility] for [facility]  [SG]    Clinical Course User Index [SG] Pricilla LovelessScott Masiel Gentzler, MD    Patient's pain is much better, now resting comfortably. Workup c/w gallstone pancreatitis. Given U/S concerning for cholecystitis, will treat with rocephin and admit. Admit to [facility] given patient preference. NPO.   Final Clinical Impressions(s) / ED Diagnoses   Final diagnoses:  Upper abdominal pain  Gallstone pancreatitis    New Prescriptions Discharge Medication List as of 04/20/2016  8:15 PM       Pricilla LovelessScott Melesa Lecy, MD 04/21/16 [phone]

## 2016-04-25 MED FILL — Hydromorphone HCl Inj 2 MG/ML: INTRAMUSCULAR | Qty: 1 | Status: AC

## 2016-05-22 ENCOUNTER — Encounter (HOSPITAL_BASED_OUTPATIENT_CLINIC_OR_DEPARTMENT_OTHER): Payer: Self-pay

## 2016-05-22 ENCOUNTER — Emergency Department (HOSPITAL_BASED_OUTPATIENT_CLINIC_OR_DEPARTMENT_OTHER)
Admission: EM | Admit: 2016-05-22 | Discharge: 2016-05-23 | Disposition: A | Discharge status: Home / Self Care | Payer: Medicare Other | Attending: Emergency Medicine | Admitting: Emergency Medicine

## 2016-05-22 DIAGNOSIS — E876 Hypokalemia: Secondary | ICD-10-CM

## 2016-05-22 DIAGNOSIS — R799 Abnormal finding of blood chemistry, unspecified: Secondary | ICD-10-CM | POA: Diagnosis present

## 2016-05-22 DIAGNOSIS — I10 Essential (primary) hypertension: Secondary | ICD-10-CM | POA: Insufficient documentation

## 2016-05-22 HISTORY — DX: Abnormal levels of other serum enzymes: R74.8

## 2016-05-22 LAB — BASIC METABOLIC PANEL
ANION GAP: 10 (ref 5–15)
BUN: 20 mg/dL (ref 6–20)
CO2: 30 mmol/L (ref 22–32)
Calcium: 9 mg/dL (ref 8.9–10.3)
Chloride: 87 mmol/L — ABNORMAL LOW (ref 101–111)
Creatinine, Ser: 0.83 mg/dL (ref 0.44–1.00)
GFR calc Af Amer: >60 mL/min (ref >60)
GFR calc non Af Amer: >60 mL/min (ref >60)
GLUCOSE: 111 mg/dL — AB (ref 65–99)
POTASSIUM: 2.4 mmol/L — AB (ref 3.5–5.1)
Sodium: 127 mmol/L — ABNORMAL LOW (ref 135–145)

## 2016-05-22 LAB — CBC
HCT: 33.3 % — ABNORMAL LOW (ref 36.0–46.0)
HEMOGLOBIN: 11.2 g/dL — AB (ref 12.0–15.0)
MCH: 28.4 pg (ref 26.0–34.0)
MCHC: 33.6 g/dL (ref 30.0–36.0)
MCV: 84.5 fL (ref 78.0–100.0)
Platelets: 213 10*3/uL (ref 150–400)
RBC: 3.94 MIL/uL (ref 3.87–5.11)
RDW: 14.6 % (ref 11.5–15.5)
WBC: 4.2 10*3/uL (ref 4.0–10.5)

## 2016-05-22 LAB — MAGNESIUM: Magnesium: 2 mg/dL (ref 1.7–2.4)

## 2016-05-22 MED ORDER — POTASSIUM CHLORIDE 10 MEQ/100ML IV SOLN
10.0000 meq | Freq: Once | INTRAVENOUS | Status: AC
  Administered 2016-05-22: 10 meq via INTRAVENOUS
  Filled 2016-05-22: qty 100

## 2016-05-22 MED ORDER — POTASSIUM CHLORIDE CRYS ER 20 MEQ PO TBCR
40.0000 meq | EXTENDED_RELEASE_TABLET | Freq: Once | ORAL | Status: AC
  Administered 2016-05-22: 40 meq via ORAL
  Filled 2016-05-22: qty 2

## 2016-05-22 MED ORDER — SODIUM CHLORIDE 0.9 % IV BOLUS (SEPSIS)
1000.0000 mL | Freq: Once | INTRAVENOUS | Status: AC
Start: 2016-05-22 — End: 2016-05-23
  Administered 2016-05-22: 1000 mL via INTRAVENOUS

## 2016-05-22 NOTE — ED Provider Notes (Signed)
[facility]   CSN: [id number] Arrival date & time: 05/22/16  2134   By signing my name below, I, [name], attest that this documentation has been prepared under the direction and in the presence of [name], PA-C Electronically Signed: Soijett Mann, ED Scribe. 05/22/16. 10:49 PM.  History   Chief Complaint Chief Complaint  Patient presents with  . Abnormal Lab    HPI [name] is a 66 y.o. female with a PMHx of HTN, who presents to the Emergency Department complaining of abnormal lab onset earlier today. Pt notes that she was seen at her PCP office today with labs drawn and informed to come into the ED for follow up to low potassium. She hasn't tried any medications for the relief of her symptoms. She denies CP, abdominal pain, nausea, vomiting, fever, chills, and any other symptoms. Pt notes that she had cholecystectomy completed on 04/22/16.  The history is provided by the patient. No language interpreter was used.    Past Medical History:  Diagnosis Date  . Acid reflux   . Anxiety   . Elevated liver enzymes   . Hypertension   . Osteoporosis   . Seizures (HCC)   . Stomach ulcer     Patient Active Problem List   Diagnosis Date Noted  . Hypertensive urgency 09/14/2012  . Polypharmacy 09/14/2012  . Anxiety 09/14/2012  . GERD (gastroesophageal reflux disease) 09/14/2012  . Seizure (HCC) 09/14/2012    Past Surgical History:  Procedure Laterality Date  . CHOLECYSTECTOMY    . EYE SURGERY    . STOMACH SURGERY    . WRIST SURGERY      OB History    No data available       Home Medications    Prior to Admission medications   Medication Sig Start Date End Date Taking? Authorizing Provider  amLODipine (NORVASC) 5 MG tablet Take 2 tablets (10 mg total) by mouth daily. 09/17/12   Zannie Cove, MD  Biotin 5 MG CAPS Take 1 capsule by mouth daily.    Historical Provider, MD  Calcium Carbonate-Vit D-Min (CALCIUM 1200 PO) Take 1  capsule by mouth daily.    Historical Provider, MD  Cholecalciferol (VITAMIN D3) 5000 UNITS CAPS Take 1 capsule by mouth daily.    Historical Provider, MD  clonazePAM (KLONOPIN) 0.25 MG disintegrating tablet Take 0.25 mg by mouth 3 (three) times daily as needed. For seizure & anxiety    Historical Provider, MD  clonazePAM (KLONOPIN) 0.5 MG tablet Take 0.5 mg by mouth at bedtime. scheduled    Historical Provider, MD  Coenzyme Q10 200 MG capsule Take 200 mg by mouth daily.    Historical Provider, MD  Denosumab (PROLIA [place]) Inject into the skin.    Historical Provider, MD  HYDROcodone-acetaminophen (NORCO/VICODIN) 5-325 MG tablet Take 1 tablet by mouth every 6 (six) hours as needed for moderate pain.    Historical Provider, MD  Magnesium 500 MG TABS Take 1 tablet by mouth daily.    Historical Provider, MD  Multiple Vitamin (MULTI-VITAMIN PO) Take 1 tablet by mouth daily.    Historical Provider, MD  potassium chloride SA (K-DUR,KLOR-CON) 20 MEQ tablet Take 2 tablets (40 mEq total) by mouth daily. 09/17/12   Zannie Cove, MD  pravastatin (PRAVACHOL) 40 MG tablet Take 60 mg by mouth daily.    Historical Provider, MD  sucralfate (CARAFATE) 1 G tablet Take 1 g by mouth 4 (four) times daily.    Historical Provider, MD  topiramate (TOPAMAX) 100 MG tablet Take 100 mg by mouth 5 (five) times daily.     Historical Provider, MD    Family History No family history on file.  Social History Social History  Substance Use Topics  . Smoking status: Never Smoker  . Smokeless tobacco: Never Used  . Alcohol use No     Allergies   Codeine; Other; Shellfish allergy; Toradol [ketorolac tromethamine]; and Tramadol hcl   Review of Systems Review of Systems  Constitutional: Negative for chills and fever.  Cardiovascular: Negative for chest pain.  Gastrointestinal: Negative for abdominal pain, nausea and vomiting.   A complete 10 system review of systems was obtained and all systems are negative except as  noted in the HPI and PMH.   Physical Exam Updated Vital Signs BP 157/83 (BP Location: Left Arm)   Pulse 65   Temp 97.8 F (36.6 C) (Oral)   Resp 18   SpO2 100%   Physical Exam  Constitutional: She is oriented to person, place, and time. She appears well-developed and well-nourished. No distress.  HENT:  Head: Normocephalic and atraumatic.  Eyes: EOM are normal.  Neck: Neck supple.  Cardiovascular: Normal rate, regular rhythm and normal heart sounds.  Exam reveals no gallop and no friction rub.   No murmur heard. Pulmonary/Chest: Effort normal and breath sounds normal. No respiratory distress. She has no wheezes. She has no rales.  Abdominal: She exhibits no distension.  Musculoskeletal: Normal range of motion.  Neurological: She is alert and oriented to person, place, and time.  Skin: Skin is warm and dry.  Psychiatric: She has a normal mood and affect. Her behavior is normal.  Nursing note and vitals reviewed.  ED Treatments / Results  DIAGNOSTIC STUDIES: Oxygen Saturation is 100% on RA, nl by my interpretation.    COORDINATION OF CARE: 10:46 PM Discussed treatment plan with pt at bedside which includes labs, potassium Rx, follow up with PCP and pt agreed to plan.   Labs (all labs ordered are listed, but only abnormal results are displayed) Labs Reviewed  BASIC METABOLIC PANEL - Abnormal; Notable for the following:       Result Value   Sodium 127 (*)    Potassium 2.4 (*)    Chloride 87 (*)    Glucose, Bld 111 (*)    All other components within normal limits  CBC - Abnormal; Notable for the following:    Hemoglobin 11.2 (*)    HCT 33.3 (*)    All other components within normal limits  MAGNESIUM  TSH    EKG  EKG Interpretation None       Procedures Procedures (including critical care time)  Medications Ordered in ED Medications  potassium chloride 10 mEq in 100 mL IVPB (10 mEq Intravenous New Bag/Given 05/22/16 2307)  potassium chloride SA  (K-DUR,KLOR-CON) CR tablet 40 mEq (40 mEq Oral Given 05/22/16 2259)  sodium chloride 0.9 % bolus 1,000 mL (1,000 mLs Intravenous New Bag/Given 05/22/16 2305)   Initial Impression / Assessment and Plan / ED Course  I have reviewed the triage vital signs and the nursing notes.  Pertinent labs & imaging results that were available during my care of the patient were reviewed by me and considered in my medical decision making (see chart for details).    I have reviewed and evaluated the relevant laboratory values. { have interpreted the relevant EKG. I have reviewed the relevant previous healthcare records. I obtained HPI from historian. Patient discussed with supervising physician.  ED Course:  Assessment: Pt is a 65yF with hx HTn who presents from PCP due to hypokalemia with potassium 2.9 in office this AM. Called by PCP this evening to come to ED for eval. Work up in past for same. Recent Cholecystectomy in December. Asymptomatic currently. On exam, pt in NAD. Nontoxic/nonseptic appearing. VSS. Afebrile. Lungs CTA. Heart RRR. Abdomen nontender soft. EKG unremarkable. No U Waves. CBC unremarkable. Na 127. Potassium 2.4. Given PO/IV potassium in ED. Plan is to DC home with Potassium PO and close follow up to PCP. At time of discharge, Patient is in no acute distress. Vital Signs are stable. Patient is able to ambulate. Patient able to tolerate PO.   Disposition/Plan:  DC Home Additional Verbal discharge instructions given and discussed with patient.  Pt Instructed to f/u with PCP in the next week for evaluation and treatment of symptoms. Return precautions given Pt acknowledges and agrees with plan  Supervising Physician Canary Brimhristopher J Tegeler, MD  Final Clinical Impressions(s) / ED Diagnoses   Final diagnoses:  Hypokalemia    New Prescriptions New Prescriptions   No medications on file   I personally performed the services described in this documentation, which was scribed in my presence.  The recorded information has been reviewed and is accurate.     Audry Piliyler Cage Gupton, PA-C 05/23/16 0008    Canary Brimhristopher J Tegeler, MD 05/23/16 [phone]

## 2016-05-22 NOTE — ED Triage Notes (Addendum)
Pt states she was seen today for her follow up with MD for K+-pt sates she had her GB removed in Dec and had low K+ at that time-was called by office and advised K+ 2.9 and to come to ED-NAD-steady gait

## 2016-05-22 NOTE — Discharge Instructions (Signed)
Please read and follow all provided instructions.  Your diagnoses today include:  1. Hypokalemia     Tests performed today include: Vital signs. See below for your results today.   Medications prescribed:  Take as prescribed   Home care instructions:  Follow any educational materials contained in this packet.  Follow-up instructions: Please follow-up with your primary care provider for further evaluation of symptoms and treatment   Return instructions:  Please return to the Emergency Department if you do not get better, if you get worse, or new symptoms OR  - Fever (temperature greater than 101.35F)  - Bleeding that does not stop with holding pressure to the area    -Severe pain (please note that you may be more sore the day after your accident)  - Chest Pain  - Difficulty breathing  - Severe nausea or vomiting  - Inability to tolerate food and liquids  - Passing out  - Skin becoming red around your wounds  - Change in mental status (confusion or lethargy)  - New numbness or weakness    Please return if you have any other emergent concerns.  Additional Information:  Your vital signs today were: BP 157/83 (BP Location: Left Arm)    Pulse 65    Temp 97.8 F (36.6 C) (Oral)    Resp 18    SpO2 100%  If your blood pressure (BP) was elevated above 135/85 this visit, please have this repeated by your doctor within one month. ---------------

## 2016-05-23 LAB — TSH: TSH: 3.291 u[IU]/mL (ref 0.350–4.500)

## 2016-05-23 MED ORDER — POTASSIUM CHLORIDE ER 20 MEQ PO TBCR: 20.0000 meq | EXTENDED_RELEASE_TABLET | Freq: Every day | ORAL | 0 refills | Status: DC

## 2016-11-23 ENCOUNTER — Encounter (HOSPITAL_BASED_OUTPATIENT_CLINIC_OR_DEPARTMENT_OTHER): Payer: Self-pay

## 2016-11-23 ENCOUNTER — Emergency Department (HOSPITAL_BASED_OUTPATIENT_CLINIC_OR_DEPARTMENT_OTHER)
Admission: EM | Admit: 2016-11-23 | Discharge: 2016-11-24 | Disposition: A | Discharge status: Home / Self Care | Payer: Medicare Other | Attending: Emergency Medicine | Admitting: Emergency Medicine

## 2016-11-23 DIAGNOSIS — R252 Cramp and spasm: Secondary | ICD-10-CM | POA: Insufficient documentation

## 2016-11-23 DIAGNOSIS — E871 Hypo-osmolality and hyponatremia: Secondary | ICD-10-CM | POA: Insufficient documentation

## 2016-11-23 DIAGNOSIS — E876 Hypokalemia: Secondary | ICD-10-CM | POA: Insufficient documentation

## 2016-11-23 DIAGNOSIS — I10 Essential (primary) hypertension: Secondary | ICD-10-CM | POA: Insufficient documentation

## 2016-11-23 DIAGNOSIS — Z79899 Other long term (current) drug therapy: Secondary | ICD-10-CM | POA: Diagnosis not present

## 2016-11-23 HISTORY — DX: Pure hypercholesterolemia, unspecified: E78.00

## 2016-11-23 NOTE — ED Triage Notes (Signed)
Pt was informed by her PCP today that her potassium was critically low and her sodium as well, was going to wait until Monday to have it sorted, but started having severe muscle cramps.  She denies chest pain, denies palpitations.

## 2016-11-24 LAB — CBC WITH DIFFERENTIAL/PLATELET
Basophils Absolute: 0 10*3/uL (ref 0.0–0.1)
Basophils Relative: 0 %
EOS PCT: 5 %
Eosinophils Absolute: 0.3 10*3/uL (ref 0.0–0.7)
HCT: 31.6 % — ABNORMAL LOW (ref 36.0–46.0)
Hemoglobin: 10.7 g/dL — ABNORMAL LOW (ref 12.0–15.0)
LYMPHS ABS: 0.8 10*3/uL (ref 0.7–4.0)
LYMPHS PCT: 16 %
MCH: 26.3 pg (ref 26.0–34.0)
MCHC: 33.9 g/dL (ref 30.0–36.0)
MCV: 77.6 fL — AB (ref 78.0–100.0)
MONO ABS: 0.7 10*3/uL (ref 0.1–1.0)
MONOS PCT: 14 %
Neutro Abs: 3.1 10*3/uL (ref 1.7–7.7)
Neutrophils Relative %: 65 %
PLATELETS: 194 10*3/uL (ref 150–400)
RBC: 4.07 MIL/uL (ref 3.87–5.11)
RDW: 15 % (ref 11.5–15.5)
WBC: 4.8 10*3/uL (ref 4.0–10.5)

## 2016-11-24 LAB — BASIC METABOLIC PANEL
ANION GAP: 8 (ref 5–15)
Anion gap: 10 (ref 5–15)
BUN: 13 mg/dL (ref 6–20)
BUN: 9 mg/dL (ref 6–20)
CALCIUM: 7.8 mg/dL — AB (ref 8.9–10.3)
CO2: 24 mmol/L (ref 22–32)
CO2: 28 mmol/L (ref 22–32)
Calcium: 8.2 mg/dL — ABNORMAL LOW (ref 8.9–10.3)
Chloride: 101 mmol/L (ref 101–111)
Chloride: 85 mmol/L — ABNORMAL LOW (ref 101–111)
Creatinine, Ser: 0.6 mg/dL (ref 0.44–1.00)
Creatinine, Ser: 0.66 mg/dL (ref 0.44–1.00)
GFR calc Af Amer: >60 mL/min (ref >60)
GFR calc Af Amer: >60 mL/min (ref >60)
GFR calc non Af Amer: >60 mL/min (ref >60)
GLUCOSE: 108 mg/dL — AB (ref 65–99)
GLUCOSE: 93 mg/dL (ref 65–99)
POTASSIUM: 2.4 mmol/L — AB (ref 3.5–5.1)
Potassium: 3.3 mmol/L — ABNORMAL LOW (ref 3.5–5.1)
Sodium: 123 mmol/L — ABNORMAL LOW (ref 135–145)
Sodium: 133 mmol/L — ABNORMAL LOW (ref 135–145)

## 2016-11-24 LAB — MAGNESIUM: Magnesium: 1.8 mg/dL (ref 1.7–2.4)

## 2016-11-24 MED ORDER — POTASSIUM CHLORIDE 10 MEQ/100ML IV SOLN
10.0000 meq | Freq: Once | INTRAVENOUS | Status: AC
Start: 2016-11-24 — End: 2016-11-24
  Administered 2016-11-24: 10 meq via INTRAVENOUS
  Filled 2016-11-24: qty 100

## 2016-11-24 MED ORDER — POTASSIUM CHLORIDE 10 MEQ/100ML IV SOLN
INTRAVENOUS | Status: AC
  Filled 2016-11-24: qty 100

## 2016-11-24 MED ORDER — SODIUM CHLORIDE 0.9 % IV BOLUS (SEPSIS)
1000.0000 mL | Freq: Once | INTRAVENOUS | Status: AC
  Administered 2016-11-24: 1000 mL via INTRAVENOUS

## 2016-11-24 MED ORDER — POTASSIUM CHLORIDE 10 MEQ/100ML IV SOLN
10.0000 meq | Freq: Once | INTRAVENOUS | Status: AC
  Administered 2016-11-24: 10 meq via INTRAVENOUS
  Filled 2016-11-24: qty 100

## 2016-11-24 MED ORDER — POTASSIUM CHLORIDE CRYS ER 20 MEQ PO TBCR
40.0000 meq | EXTENDED_RELEASE_TABLET | Freq: Once | ORAL | Status: AC
  Administered 2016-11-24: 40 meq via ORAL
  Filled 2016-11-24: qty 2

## 2016-11-24 MED ORDER — POTASSIUM CHLORIDE ER 20 MEQ PO TBCR: 40.0000 meq | EXTENDED_RELEASE_TABLET | Freq: Two times a day (BID) | ORAL | 0 refills | Status: DC

## 2016-11-24 MED ORDER — MAGNESIUM SULFATE 2 GM/50ML IV SOLN
2.0000 g | Freq: Once | INTRAVENOUS | Status: AC
  Administered 2016-11-24: 2 g via INTRAVENOUS
  Filled 2016-11-24: qty 50

## 2016-11-24 MED ORDER — POTASSIUM CHLORIDE 10 MEQ/100ML IV SOLN
10.0000 meq | Freq: Once | INTRAVENOUS | Status: AC
  Administered 2016-11-24: 10 meq via INTRAVENOUS

## 2016-11-24 NOTE — ED Notes (Signed)
Pt verbalizes understanding of d/c instructions and denies any further needs at this time. 

## 2016-11-24 NOTE — ED Provider Notes (Signed)
[facility]   CSN: [id number] Arrival date & time: 11/23/16  2316     History   Chief Complaint Chief Complaint  Patient presents with  . Abnormal Lab    HPI [name] is a 66 y.o. female.  The history is provided by the patient.  She saw her PCP yesterday, and routine lab draw showed very low sodium and potassium. She was planning on increasing her potassium supplement from 1 pill a day to 2 pills a day, and trying to eat extra salt. However, she developed severe leg cramps and decided to come to the ED. Leg cramps have resolved. She is currently feeling fine. Of note, she had been taking hydrochlorothiazide, and this has been discontinued.  Past Medical History:  Diagnosis Date  . Acid reflux   . Anxiety   . Elevated liver enzymes   . High cholesterol   . Hypertension   . Osteoporosis   . Seizures (HCC)   . Stomach ulcer     Patient Active Problem List   Diagnosis Date Noted  . Hypertensive urgency 09/14/2012  . Polypharmacy 09/14/2012  . Anxiety 09/14/2012  . GERD (gastroesophageal reflux disease) 09/14/2012  . Seizure (HCC) 09/14/2012    Past Surgical History:  Procedure Laterality Date  . CHOLECYSTECTOMY    . EYE SURGERY    . STOMACH SURGERY    . WRIST SURGERY      OB History    No data available       Home Medications    Prior to Admission medications   Medication Sig Start Date End Date Taking? Authorizing Provider  amLODipine (NORVASC) 5 MG tablet Take 2 tablets (10 mg total) by mouth daily. 09/17/12   Zannie CoveJoseph, Preetha, MD  Biotin 5 MG CAPS Take 1 capsule by mouth daily.    [provider]  Calcium Carbonate-Vit D-Min (CALCIUM 1200 PO) Take 1 capsule by mouth daily.    [provider]  Cholecalciferol (VITAMIN D3) 5000 UNITS CAPS Take 1 capsule by mouth daily.    [provider]  clonazePAM (KLONOPIN) 0.25 MG disintegrating tablet Take 0.25 mg by mouth 3 (three) times daily as needed.  For seizure & anxiety    [provider]  clonazePAM (KLONOPIN) 0.5 MG tablet Take 0.5 mg by mouth at bedtime. scheduled    [provider]  Coenzyme Q10 200 MG capsule Take 200 mg by mouth daily.    [provider]  Denosumab (PROLIA Tiro) Inject into the skin.    [provider]  HYDROcodone-acetaminophen (NORCO/VICODIN) 5-325 MG tablet Take 1 tablet by mouth every 6 (six) hours as needed for moderate pain.    [provider]  Magnesium 500 MG TABS Take 1 tablet by mouth daily.    [provider]  Multiple Vitamin (MULTI-VITAMIN PO) Take 1 tablet by mouth daily.    [provider]  potassium chloride 20 MEQ TBCR Take 20 mEq by mouth daily. 05/23/16   Audry PiliMohr, Tyler, PA-C  potassium chloride SA (K-DUR,KLOR-CON) 20 MEQ tablet Take 2 tablets (40 mEq total) by mouth daily. 09/17/12   Zannie CoveJoseph, Preetha, MD  pravastatin (PRAVACHOL) 40 MG tablet Take 60 mg by mouth daily.    [provider]  sucralfate (CARAFATE) 1 G tablet Take 1 g by mouth 4 (four) times daily.    [provider]  topiramate (TOPAMAX) 100 MG tablet Take 100 mg by mouth 5 (five) times daily.     [provider]  Family History No family history on file.  Social History Social History  Substance Use Topics  . Smoking status: Never Smoker  . Smokeless tobacco: Never Used  . Alcohol use No     Allergies   Codeine; Other; Shellfish allergy; Toradol [ketorolac tromethamine]; and Tramadol hcl   Review of Systems Review of Systems  All other systems reviewed and are negative.    Physical Exam Updated Vital Signs BP (!) 159/73 (BP Location: Left Arm)   Pulse 68   Temp 97.9 F (36.6 C) (Oral)   Resp 18   Ht 5\' 3"  (1.6 m)   Wt 45.4 kg (100 lb)   SpO2 100%   BMI 17.71 kg/m   Physical Exam  Nursing note and vitals reviewed. 66 year old female, resting comfortably and in no acute distress. Vital signs are significant for hypertension.  Oxygen saturation is 100%, which is normal. Head is normocephalic and atraumatic. PERRLA, EOMI. Oropharynx is clear. Neck is nontender and supple without adenopathy or JVD. Back is nontender and there is no CVA tenderness. Lungs are clear without rales, wheezes, or rhonchi. Chest is nontender. Heart has regular rate and rhythm without murmur. Abdomen is soft, flat, nontender without masses or hepatosplenomegaly and peristalsis is normoactive. Extremities have no cyanosis or edema, full range of motion is present. Skin is warm and dry without rash. Neurologic: Mental status is normal, cranial nerves are intact, there are no motor or sensory deficits.   ED Treatments / Results  Labs (all labs ordered are listed, but only abnormal results are displayed) Labs Reviewed  CBC WITH DIFFERENTIAL/PLATELET - Abnormal; Notable for the following:       Result Value   Hemoglobin 10.7 (*)    HCT 31.6 (*)    MCV 77.6 (*)    All other components within normal limits  BASIC METABOLIC PANEL - Abnormal; Notable for the following:    Sodium 123 (*)    Potassium 2.4 (*)    Chloride 85 (*)    Calcium 8.2 (*)    All other components within normal limits  BASIC METABOLIC PANEL - Abnormal; Notable for the following:    Sodium 133 (*)    Potassium 3.3 (*)    Glucose, Bld 108 (*)    Calcium 7.8 (*)    All other components within normal limits  MAGNESIUM    EKG  EKG Interpretation  Date/Time:  Saturday November 24 2016 00:04:06 EDT Ventricular Rate:  67 PR Interval:    QRS Duration: 105 QT Interval:  471 QTC Calculation: 498 R Axis:   84 Text Interpretation:  Sinus rhythm Borderline right axis deviation Probable left ventricular hypertrophy Anterior Q waves, possibly due to LVH When compared with ECG of 05/22/2016, No significant change was found Confirmed by Dione BoozeGlick, Felicita Nuncio ([id number]) on 11/24/2016 12:14:08 AM        Procedures Procedures (including critical care time)  Medications Ordered in  ED Medications  sodium chloride 0.9 % bolus 1,000 mL (not administered)  potassium chloride 10 mEq in 100 mL IVPB (not administered)  potassium chloride 10 mEq in 100 mL IVPB (not administered)  potassium chloride SA (K-DUR,KLOR-CON) CR tablet 40 mEq (not administered)     Initial Impression / Assessment and Plan / ED Course  I have reviewed the triage vital signs and the nursing notes.  Pertinent lab results that were available during my care of the patient were reviewed by me and considered in my medical decision making (see chart for details).  Hypokalemia and hyponatremia. Her lab results were available on care everywhere. Potassium was 2.8, sodium 123 with normal renal function. Curiously, she has had very low potassiums going all the way back to 2003, including values as low as 2.7. Sodium had been 126 in January 2018. I suspect her electrolyte disturbances related to her hydrochlorothiazide. She clearly needs much more aggressive potassium replacement. I do not think that the sodium level is critical, but she will be given normal saline intravenously. She'll be given both oral and intravenous potassium as well as intravenous magnesium. Of note, ECG does not show changes of hypokalemia.  Following above-noted treatment, potassium is come up to 3.3, and sodium is increased to 133. At this point, I feel she is safe for discharge home. Disturbances are not likely to recur since she has stopped taking her diuretic. She does express concern because she has had problems with leg swelling. She may benefit from being on a potassium sparing diuretic. Alternatively, may benefit from every other day diuretic use which would limit the amount of problems she has with hyponatremia and hypokalemia. She is discharged with prescription for K Dur, and instructed to follow-up with PCP. Basic metabolic panel should be repeated in one week.  Final Clinical Impressions(s) / ED Diagnoses   Final diagnoses:   Hypokalemia  Hyponatremia    New Prescriptions Discharge Medication List as of 11/24/2016  6:50 AM       Dione Booze, MD 11/25/16 [phone]

## 2016-11-24 NOTE — Discharge Instructions (Signed)
Your low potassium and sodium were probably from the diuretic you were on (HCTZ). Since you are discontinuing the medication, you should not have any more problems. I am putting you on a much higher dose of potassium to try to build your body's reserves up.

## 2017-05-24 ENCOUNTER — Ambulatory Visit: Payer: Self-pay | Admitting: Orthopedic Surgery

## 2017-05-24 NOTE — Progress Notes (Signed)
Please place orders in Epic as patient is being scheduled for a pre-op appointment! Thank you! 

## 2017-05-28 ENCOUNTER — Ambulatory Visit: Payer: Self-pay | Admitting: Orthopedic Surgery

## 2017-06-13 ENCOUNTER — Encounter (HOSPITAL_COMMUNITY): Admission: RE | Admit: 2017-06-13 | Payer: Medicare Other | Source: Ambulatory Visit

## 2017-06-14 ENCOUNTER — Ambulatory Visit: Payer: Self-pay | Admitting: Orthopedic Surgery

## 2017-06-14 NOTE — H&P (Signed)
[name], [name] [phone], F) DOB Sep 27, 1950   Chief Complaint Left Hip Pain H&P left total hip 06/19/17  Patient's Care Team Primary Care Provider: Bernadette Hoit MD: [address], [place], [place] [address], Ph [phone], Fax [phone] NPI: [id number]  Patient's Pharmacies [id number]): [address], [place] [address], Ph [phone], Fax [phone]   Vitals Ht: 5 ft 3 in  Wt:  102 lbs Pain Scale: 10  BP - 138/72 Pulse - 76   Allergies Reviewed Allergies ATORVASTATIN: Rash  CELEBREX  CODEINE: - Throat Swelling  DILANTIN: Rash  SHELLFISH DERIVED: - Pateint IS ABLE to use topical Beatadine  SIMVASTATIN: Rash  TORADOL: Hallucinations, Seizure  TRAMADOL: Hallucinations, Seizure  TRAZODONE     Medications Reviewed Medications amLODIPine 10 mg tablet Take 1 tablet(s) every day by oral route. 05/20/17   entered Faith Brown biotin 05/20/17   entered Faith Brown calcium 05/20/17   entered Faith Brown clonazePAM 05/20/17   entered Faith Brown clonazePAM 0.25 mg disintegrating tablet TAKE 1 TABLET BY MOUTH AS NEEDED FOR ANXIETY 11/22/16   filled surescripts Compazine 05/20/17   entered [employer] Internal Note: Iron Capsule 06/14/17   entered ALEXZANDREW PERKINS, PA-C Fish Oil 06/14/17   entered ALEXZANDREW PERKINS, PA-C gabapentin 100 mg capsule TAKE 2 CAPSULES BY MOUTH 3 TIMES A DAY 05/12/17   filled [employer] Hair, Skin and Nails Advanced 06/14/17   entered ALEXZANDREW PERKINS, PA-C HYDROcodone 5 mg-acetaminophen 325 mg tablet TAKE 1 TABLET BY MOUTH EVERY 8 HOURS AS NEEDED 05/12/17   filled [employer] Klor-Con M20 mEq tablet,extended release TAKE 1 TABLET (20 MEQ TOTAL) BY MOUTH 2 TIMES DAILY. 04/15/17   filled surescripts magnesium 06/14/17   entered ALEXZANDREW PERKINS, PA-C meclizine 25 mg tablet TAKE 1 TABLET BY MOUTH DAILY AS NEEDED 11/22/16   filled surescripts melatonin 06/14/17    entered ALEXZANDREW PERKINS, PA-C meloxicam 15 mg tablet TAKE 1 TABLET BY MOUTH EVERY DAY 05/23/17   filled [employer] pravastatin 20 mg tablet Take 1 tablet(s) every day by oral route. 05/20/17   entered Faith Brown prochlorperazine maleate 10 mg tablet TAKE 1 TABLET BY MOUTH EVERY 6 HOURS AS NEEDED 05/02/17   filled [employer] Internal Note: eye supplement 05/23/17   entered Faith Brown sucralfate 1 gram tablet TAKE 1 TABLET BY MOUTH 4 TIMES A DAY 03/29/17   filled surescripts topiramate 100 mg tablet TAKE 1 TABLET BY MOUTH FOUR TIMES A DAY 05/14/17   filled [employer] turmeric 06/14/17   entered ALEXZANDREW PERKINS, PA-C Vitamin D3 05/20/17   entered Faith Brown zinc 06/14/17   entered ALEXZANDREW PERKINS, PA-C   Problems Reviewed Problems Avascular necrosis of bone of hip =   Family History Reviewed Family History Father - Father deceased   - Family history of malignant neoplasm of lung   - History of hypertension Mother - Mother deceased   - History of malignant neoplasm of liver   Social History Reviewed Social History Smoking Status: Never smoker Non-smoker Tobacco-years of use: 0 Chewing tobacco: none Alcohol intake: None Hand Dominance: Right Work related injury?: N Advance directive: Y (Notes: Living Will) Live alone or with others?: alone Marital status: Divorced   Surgical History Reviewed Surgical History Eye surgery procedure Cholecystectomy - 2017 Extraction of wisdom tooth Strabismus surgery Augmentation mammoplasty Stomach surgery procedure Tubal ligation - 05/01/1979    Past Medical History Reviewed Past Medical History Anemia: Y Anxiety Disorder: Y Depression: Y GERD/Reflux: Y Headaches: Jeannie Fend  High Cholesterol: Y Hypertension: Y Joint Pain: Y Osteoarthritis: Y Osteoporosis: Y Previous Fracture(s): Y Previous Oral Steroid(s): Y Seizures/Epilepsy: Y Weight loss: Y Notes:  Macular Degeneration,  Impaired Vision,  Osteoporosis,  Menopause,  Childhood Measles,  Childhood Mumps   HPI Patient is scheduled for a left total hip by Dr. Lequita Halt on 06/19/2017 at [facility]. [name] was seen for a second opinion for her left hip. She has a history of lumbar pain that has been treated by Dr. Edmon Crape. She states that she started having some pain in her hip in August. She reports that the majority of her hip pain is lateral. She did have some previous groin pain, but that resolved on it's own. She has not had any previous treatment for the hip, but does report that she has been on medication for osteoporosis for several years, after suffering from multiple fractures with really no injury. She is taking a small amount of hydrocodone for pain. She states that she cuts the tablets in fourths, and usually only takes it twice a day. She had an X-ray in November, followed by an MRI. These were done at [facility], and showed evidence of AVN. She was scheduled for evaluation of her hip by Dr. Thamas Jaegers for a surgical discussion. She states that she would rather have her surgery performed here in [place]. She has avascular necrosis of the LEFT hip and also secondarily has arthritic changes. She has severe pain and dysfunction with this. At this point the most predictable means of improving pain and function will be total hip arthroplasty. We did discuss the procedure risks potential complications and rehab course in detail and she would like to go ahead and proceed.   ROS Constitutional: Constitutional: no fever, chills, night sweats, or significant weight loss.  Cardiovascular: Cardiovascular: no palpitations or chest pain.  Respiratory: Respiratory: no cough or shortness of breath and No COPD.  Gastrointestinal: Gastrointestinal: no vomiting and nausea.  Musculoskeletal: Musculoskeletal: Joint Pain and back pain.  Neurologic: Neurologic: no numbness,  tingling, or difficulty with balance.   Physical Exam Patient is a 67 year old female.  General Mental Status - Alert, cooperative and good historian. General Appearance - pleasant, Not in acute distress. Orientation - Oriented X3. Build & Nutrition - Well nourished and Well developed.  Head and Neck Head - normocephalic, atraumatic . Neck Global Assessment - supple, no bruit auscultated on the right, no bruit auscultated on the left.  Eye Wears Glasses Pupil - Bilateral - PERR Motion - Bilateral - EOMI.  Chest and Lung Exam Auscultation Breath sounds - clear at anterior chest wall and clear at posterior chest wall. Adventitious sounds - No Adventitious sounds.  Cardiovascular Auscultation Rhythm - Regular rate and rhythm. Heart Sounds - S1 WNL and S2 WNL. Murmurs & Other Heart Sounds - Auscultation of the heart reveals - No Murmurs.  Abdomen Palpation/Percussion Tenderness - Abdomen is non-tender to palpation. Abdomen is soft. Auscultation Auscultation of the abdomen reveals - Bowel sounds normal.  Genitourinary Note: Not done, not pertinent to present illness  Musculoskeletal RIGHT hip can be flexed to 120, rotated and 40, out 40 and abduct to 50 without discomfort. Her LEFT hip can be flexed to 110, rotation and 20, about 30 and abduct to 30 with discomfort. She has no tenderness on either greater trochanter. She has a significant antalgic gait pattern on the LEFT. Pulses sensation and motor are intact.  Radiographs-AP pelvis and lateral of the LEFT hip show significant joint  space narrowing on the LEFT. She may have a small area of collapse centrally in the femoral head.  Review of her MRI scan shows significant edema throughout the femoral head neck with an area of collapse of the femoral head. Her RIGHT hip looks normal.    Assessment / Plan 1. 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 Riley NearingAguiar IV TXA Anesthesia Issues: None  Patient was instructed on what medications to stop prior to surgery. - Follow up visit in 2 weeks with Dr. Lequita HaltAluisio - Begin physical therapy following surgery - Pre-operative lab work as pre Pre-Surgical Testing - Prescriptions will be provided in hospital at time of discharge  Encounter signed-off by Patrica DuelALEXZANDREW PERKINS, PA-C

## 2017-06-19 ENCOUNTER — Inpatient Hospital Stay (HOSPITAL_COMMUNITY): Admission: RE | Admit: 2017-06-19 | Payer: Medicare Other | Source: Ambulatory Visit | Admitting: Orthopedic Surgery

## 2017-06-19 ENCOUNTER — Encounter (HOSPITAL_COMMUNITY): Admission: RE | Payer: Self-pay | Source: Ambulatory Visit

## 2017-06-19 SURGERY — ARTHROPLASTY, HIP, TOTAL, ANTERIOR APPROACH
Anesthesia: Choice | Site: Hip | Laterality: Left

## 2017-07-08 ENCOUNTER — Ambulatory Visit: Payer: Self-pay | Admitting: Orthopedic Surgery

## 2017-07-11 ENCOUNTER — Encounter (HOSPITAL_COMMUNITY): Payer: Self-pay

## 2017-07-11 NOTE — Patient Instructions (Signed)
Your procedure is scheduled on: Wednesday, July 17, 2017   Surgery Time:  10:15AM-11:45AM   Report to [facility] Main  Entrance    Report to admitting at 7:45 AM   Call this number if you have problems the morning of surgery [phone]   Do not eat food or drink liquids :After Midnight.   Do NOT smoke after Midnight   Take these medicines the morning of surgery with A SIP OF WATER: Amlodipine, Clonazepam, Sucralfate, Topiramate                               You may not have any metal on your body including hair pins, jewelry, and body piercings             Do not wear make-up, lotions, powders, perfumes/cologne, or deodorant             Do not wear nail polish.  Do not shave  48 hours prior to surgery.          Do not bring valuables to the hospital. [facility] IS NOT             RESPONSIBLE   FOR VALUABLES.   Contacts, dentures or bridgework may not be worn into surgery.   Leave suitcase in the car. After surgery it may be brought to your room.   Special Instructions: Bring a copy of your healthcare power of attorney and living will documents         the day of surgery if you haven't scanned them in before.              Please read over the following fact sheets you were given:  [facility] - Preparing for Surgery Before surgery, you can play an important role.  Because skin is not sterile, your skin needs to be as free of germs as possible.  You can reduce the number of germs on your skin by washing with CHG (chlorahexidine gluconate) soap before surgery.  CHG is an antiseptic cleaner which kills germs and bonds with the skin to continue killing germs even after washing. Please DO NOT use if you have an allergy to CHG or antibacterial soaps.  If your skin becomes reddened/irritated stop using the CHG and inform your nurse when you arrive at Short Stay. Do not shave (including legs and underarms) for at least 48 hours prior to the first CHG shower.  You may  shave your face/neck.  Please follow these instructions carefully:  1.  Shower with CHG Soap the night before surgery and the  morning of surgery.  2.  If you choose to wash your hair, wash your hair first as usual with your normal  shampoo.  3.  After you shampoo, rinse your hair and body thoroughly to remove the shampoo.                             4.  Use CHG as you would any other liquid soap.  You can apply chg directly to the skin and wash.  Gently with a scrungie or clean washcloth.  5.  Apply the CHG Soap to your body ONLY FROM THE NECK DOWN.   Do   not use on face/ open  Wound or open sores. Avoid contact with eyes, ears mouth and   genitals (private parts).                       Wash face,  Genitals (private parts) with your normal soap.             6.  Wash thoroughly, paying special attention to the area where your    surgery  will be performed.  7.  Thoroughly rinse your body with warm water from the neck down.  8.  DO NOT shower/wash with your normal soap after using and rinsing off the CHG Soap.                9.  Pat yourself dry with a clean towel.            10.  Wear clean pajamas.            11.  Place clean sheets on your bed the night of your first shower and do not  sleep with pets. Day of Surgery : Do not apply any lotions/deodorants the morning of surgery.  Please wear clean clothes to the hospital/surgery center.  FAILURE TO FOLLOW THESE INSTRUCTIONS MAY RESULT IN THE CANCELLATION OF YOUR SURGERY  PATIENT SIGNATURE_________________________________  NURSE SIGNATURE__________________________________  ________________________________________________________________________   [name]  An incentive spirometer is a tool that can help keep your lungs clear and active. This tool measures how well you are filling your lungs with each breath. Taking long deep breaths may help reverse or decrease the chance of developing breathing  (pulmonary) problems (especially infection) following:  A long period of time when you are unable to move or be active. BEFORE THE PROCEDURE   If the spirometer includes an indicator to show your best effort, your nurse or respiratory therapist will set it to a desired goal.  If possible, sit up straight or lean slightly forward. Try not to slouch.  Hold the incentive spirometer in an upright position. INSTRUCTIONS FOR USE  1. Sit on the edge of your bed if possible, or sit up as far as you can in bed or on a chair. 2. Hold the incentive spirometer in an upright position. 3. Breathe out normally. 4. Place the mouthpiece in your mouth and seal your lips tightly around it. 5. Breathe in slowly and as deeply as possible, raising the piston or the ball toward the top of the column. 6. Hold your breath for 3-5 seconds or for as long as possible. Allow the piston or ball to fall to the bottom of the column. 7. Remove the mouthpiece from your mouth and breathe out normally. 8. Rest for a few seconds and repeat Steps 1 through 7 at least 10 times every 1-2 hours when you are awake. Take your time and take a few normal breaths between deep breaths. 9. The spirometer may include an indicator to show your best effort. Use the indicator as a goal to work toward during each repetition. 10. After each set of 10 deep breaths, practice coughing to be sure your lungs are clear. If you have an incision (the cut made at the time of surgery), support your incision when coughing by placing a pillow or rolled up towels firmly against it. Once you are able to get out of bed, walk around indoors and cough well. You may stop using the incentive spirometer when instructed by your caregiver.  RISKS AND COMPLICATIONS  Take your time  so you do not get dizzy or light-headed.  If you are in pain, you may need to take or ask for pain medication before doing incentive spirometry. It is harder to take a deep breath if you  are having pain. AFTER USE  Rest and breathe slowly and easily.  It can be helpful to keep track of a log of your progress. Your caregiver can provide you with a simple table to help with this. If you are using the spirometer at home, follow these instructions: [place] IF:   You are having difficultly using the spirometer.  You have trouble using the spirometer as often as instructed.  Your pain medication is not giving enough relief while using the spirometer.  You develop fever of 100.5 F (38.1 C) or higher. SEEK IMMEDIATE MEDICAL CARE IF:   You cough up bloody sputum that had not been present before.  You develop fever of 102 F (38.9 C) or greater.  You develop worsening pain at or near the incision site. MAKE SURE YOU:   Understand these instructions.  Will watch your condition.  Will get help right away if you are not doing well or get worse. Document Released: 08/27/2006 Document Revised: 07/09/2011 Document Reviewed: 10/28/2006 ExitCare Patient Information 2014 ExitCare, [place].   ________________________________________________________________________  WHAT IS A BLOOD TRANSFUSION? Blood Transfusion Information  A transfusion is the replacement of blood or some of its parts. Blood is made up of multiple cells which provide different functions.  Red blood cells carry oxygen and are used for blood loss replacement.  White blood cells fight against infection.  Platelets control bleeding.  Plasma helps clot blood.  Other blood products are available for specialized needs, such as hemophilia or other clotting disorders. BEFORE THE TRANSFUSION  Who gives blood for transfusions?   Healthy volunteers who are fully evaluated to make sure their blood is safe. This is blood bank blood. Transfusion therapy is the safest it has ever been in the practice of medicine. Before blood is taken from a donor, a complete history is taken to make sure that person has  no history of diseases nor engages in risky social behavior (examples are intravenous drug use or sexual activity with multiple partners). The donor's travel history is screened to minimize risk of transmitting infections, such as malaria. The donated blood is tested for signs of infectious diseases, such as HIV and hepatitis. The blood is then tested to be sure it is compatible with you in order to minimize the chance of a transfusion reaction. If you or a relative donates blood, this is often done in anticipation of surgery and is not appropriate for emergency situations. It takes many days to process the donated blood. RISKS AND COMPLICATIONS Although transfusion therapy is very safe and saves many lives, the main dangers of transfusion include:   Getting an infectious disease.  Developing a transfusion reaction. This is an allergic reaction to something in the blood you were given. Every precaution is taken to prevent this. The decision to have a blood transfusion has been considered carefully by your caregiver before blood is given. Blood is not given unless the benefits outweigh the risks. AFTER THE TRANSFUSION  Right after receiving a blood transfusion, you will usually feel much better and more energetic. This is especially true if your red blood cells have gotten low (anemic). The transfusion raises the level of the red blood cells which carry oxygen, and this usually causes an energy increase.  The  nurse administering the transfusion will monitor you carefully for complications. HOME CARE INSTRUCTIONS  No special instructions are needed after a transfusion. You may find your energy is better. Speak with your caregiver about any limitations on activity for underlying diseases you may have. SEEK MEDICAL CARE IF:   Your condition is not improving after your transfusion.  You develop redness or irritation at the intravenous (IV) site. SEEK IMMEDIATE MEDICAL CARE IF:  Any of the following  symptoms occur over the next 12 hours:  Shaking chills.  You have a temperature by mouth above 102 F (38.9 C), not controlled by medicine.  Chest, back, or muscle pain.  People around you feel you are not acting correctly or are confused.  Shortness of breath or difficulty breathing.  Dizziness and fainting.  You get a rash or develop hives.  You have a decrease in urine output.  Your urine turns a dark color or changes to pink, red, or brown. Any of the following symptoms occur over the next 10 days:  You have a temperature by mouth above 102 F (38.9 C), not controlled by medicine.  Shortness of breath.  Weakness after normal activity.  The white part of the eye turns yellow (jaundice).  You have a decrease in the amount of urine or are urinating less often.  Your urine turns a dark color or changes to pink, red, or brown. Document Released: 04/13/2000 Document Revised: 07/09/2011 Document Reviewed: 12/01/2007 [facility] Patient Information 2014 [place], [place].  _______________________________________________________________________

## 2017-07-11 NOTE — Pre-Procedure Instructions (Signed)
The following are the patient's hard chart: Surgical Clearance Dr. Riley NearingAguiar EKG 06/06/2017 CBC with diff 07/01/2017

## 2017-07-12 ENCOUNTER — Encounter (HOSPITAL_COMMUNITY)
Admission: RE | Admit: 2017-07-12 | Discharge: 2017-07-12 | Disposition: A | Discharge status: Home / Self Care | Payer: Medicare Other | Source: Ambulatory Visit | Attending: Orthopedic Surgery | Admitting: Orthopedic Surgery

## 2017-07-12 ENCOUNTER — Encounter (HOSPITAL_COMMUNITY): Payer: Self-pay

## 2017-07-12 ENCOUNTER — Other Ambulatory Visit: Payer: Self-pay

## 2017-07-12 DIAGNOSIS — Z01812 Encounter for preprocedural laboratory examination: Secondary | ICD-10-CM | POA: Insufficient documentation

## 2017-07-12 DIAGNOSIS — M1612 Unilateral primary osteoarthritis, left hip: Secondary | ICD-10-CM | POA: Diagnosis not present

## 2017-07-12 HISTORY — DX: Personal history of other diseases of the digestive system: Z87.19

## 2017-07-12 HISTORY — DX: Personal history of peptic ulcer disease: Z87.11

## 2017-07-12 HISTORY — DX: Idiopathic aseptic necrosis of left femur: M87.052

## 2017-07-12 HISTORY — DX: Hypo-osmolality and hyponatremia: E87.1

## 2017-07-12 HISTORY — DX: Headache: R51

## 2017-07-12 HISTORY — DX: Unspecified macular degeneration: H35.30

## 2017-07-12 HISTORY — DX: Personal history of other infectious and parasitic diseases: Z86.19

## 2017-07-12 HISTORY — DX: Major depressive disorder, single episode, unspecified: F32.9

## 2017-07-12 HISTORY — DX: Personal history of other medical treatment: Z92.89

## 2017-07-12 HISTORY — DX: Other complications of anesthesia, initial encounter: T88.59XA

## 2017-07-12 HISTORY — DX: Depression, unspecified: F32.A

## 2017-07-12 HISTORY — DX: Personal history of other endocrine, nutritional and metabolic disease: Z86.39

## 2017-07-12 HISTORY — DX: Headache, unspecified: R51.9

## 2017-07-12 HISTORY — DX: Adverse effect of unspecified anesthetic, initial encounter: T41.45XA

## 2017-07-12 HISTORY — DX: Anemia, unspecified: D64.9

## 2017-07-12 HISTORY — DX: Unspecified osteoarthritis, unspecified site: M19.90

## 2017-07-12 LAB — COMPREHENSIVE METABOLIC PANEL
ALT: 22 U/L (ref 14–54)
AST: 25 U/L (ref 15–41)
Albumin: 4 g/dL (ref 3.5–5.0)
Alkaline Phosphatase: 115 U/L (ref 38–126)
Anion gap: 10 (ref 5–15)
BUN: 16 mg/dL (ref 6–20)
CHLORIDE: 98 mmol/L — AB (ref 101–111)
CO2: 24 mmol/L (ref 22–32)
CREATININE: 0.72 mg/dL (ref 0.44–1.00)
Calcium: 9.1 mg/dL (ref 8.9–10.3)
GFR calc non Af Amer: >60 mL/min (ref >60)
Glucose, Bld: 110 mg/dL — ABNORMAL HIGH (ref 65–99)
POTASSIUM: 3.4 mmol/L — AB (ref 3.5–5.1)
SODIUM: 132 mmol/L — AB (ref 135–145)
Total Bilirubin: 0.4 mg/dL (ref 0.3–1.2)
Total Protein: 7 g/dL (ref 6.5–8.1)

## 2017-07-12 LAB — SURGICAL PCR SCREEN
MRSA, PCR: NEGATIVE
Staphylococcus aureus: POSITIVE — AB

## 2017-07-12 LAB — PROTIME-INR
INR: 1.07
Prothrombin Time: 13.8 seconds (ref 11.4–15.2)

## 2017-07-12 LAB — APTT: APTT: 32 s (ref 24–36)

## 2017-07-12 LAB — ABO/RH: ABO/RH(D): O NEG

## 2017-07-15 NOTE — Pre-Procedure Instructions (Signed)
CMP and PCR results 07/12/2017 faxed to Dr. Lequita HaltAluisio via epic.

## 2017-07-16 ENCOUNTER — Ambulatory Visit: Payer: Self-pay | Admitting: Orthopedic Surgery

## 2017-07-16 MED ORDER — TRANEXAMIC ACID 1000 MG/10ML IV SOLN
1000.0000 mg | INTRAVENOUS | Status: AC
  Administered 2017-07-17: 1000 mg via INTRAVENOUS
  Filled 2017-07-16: qty 1100

## 2017-07-16 NOTE — H&P (View-Only) (Signed)
[name], [name] [phone](66yo, F)  DOB 23-Mar-1951    Chief Complaint Left Hip Pain H&P left total hip 07/17/2017  Patient's Care Team Primary Care Provider: Bernadette HoitAFAELA AGUIAR MD: [id number], [place], [place] [id number], Ph [phone], Fax [phone] NPI: [id number] Patient's Pharmacies [id number]): [address], [place] [id number], Ph [phone], Fax [phone]   Vitals Ht: 5 ft 3 in  BP: 165/85  Pulse: 68 bpm    Allergies Reviewed Allergies ATORVASTATIN: Rash  CELEBREX: Rash  CODEINE: - Throat Swelling  DILANTIN: Rash  SHELLFISH DERIVED: - Pateint IS ABLE to use topical Beatadine  SIMVASTATIN: Rash  TORADOL: Hallucinations, Seizure  TRAMADOL: Hallucinations, Seizure  TRAZODONE: Seizure    Medications Reviewed Medications amLODIPine 10 mg tablet Take 1 tablet(s) every day by oral route. 05/20/17   entered [name] biotin 05/20/17   entered [name] calcium 05/20/17   entered [name] clonazePAM 0.25 mg disintegrating tablet TAKE 1 TABLET BY MOUTH AS NEEDED FOR ANXIETY 11/22/16   filled surescripts Compazine 15 mg daily 05/20/17   entered [employer]ith Brown Ferus 85 mg daily Internal Note: Iron Capsule 06/14/17   entered ALEXZANDREW PERKINS, PA-C Fish Oil 06/14/17   entered ALEXZANDREW PERKINS, PA-C gabapentin 100 mg capsule 3 capsules at night 07/16/17   entered ALEXZANDREW PERKINS, PA-C Hair, Skin and Nails Advanced 06/14/17   entered ALEXZANDREW PERKINS, PA-C HYDROcodone 5 mg-acetaminophen 325 mg tablet TAKE 1 TABLET BY MOUTH EVERY 8 HOURS AS NEEDED 06/11/17   filled [employer] Klor-Con M20 mEq tablet,extended release TAKE 1 TABLET (20 MEQ TOTAL) BY MOUTH 2 TIMES DAILY. 06/27/17   filled [employer] magnesium 06/14/17   entered ALEXZANDREW PERKINS, PA-C meclizine 25 mg tablet TAKE 1 TABLET BY MOUTH DAILY AS NEEDED 11/22/16   filled surescripts melatonin 06/14/17   entered ALEXZANDREW PERKINS,  PA-C meloxicam 15 mg tablet TAKE 1 TABLET BY MOUTH EVERY DAY 05/23/17   filled [employer] pravastatin 40 mg tablet Total of 60 mg daily 07/16/17   entered ALEXZANDREW PERKINS, PA-C prochlorperazine maleate 10 mg tablet TAKE 1 TABLET BY MOUTH EVERY 6 HOURS AS NEEDED 07/15/17   filled [employer] Internal Note: eye supplement 05/23/17   entered [name] sucralfate 1 gram tablet TAKE 1 TABLET BY MOUTH 4 TIMES A DAY 06/05/17   filled [employer] topiramate 100 mg tablet TAKE 1 TABLET BY MOUTH FOUR TIMES A DAY 07/05/17   filled [employer] Vitamin D3 05/20/17   entered [name] zinc 06/14/17   entered ALEXZANDREW PERKINS, PA-C    Family History Reviewed Family History Father - Father deceased   - Family history of malignant neoplasm of lung   - History of hypertension Mother - Mother deceased   - History of malignant neoplasm of liver   Social History Reviewed Social History Smoking Status: Never smoker Non-smoker Tobacco-years of use: 0 Chewing tobacco: none Alcohol intake: None Hand Dominance: Right Work related injury?: N Advance directive: Y (Notes: Living Will) Live alone or with others?: alone Marital status: Divorced   Surgical History Reviewed Surgical History Eye surgery procedure Cholecystectomy - 2017 Extraction of wisdom tooth Strabismus surgery Augmentation mammoplasty Stomach surgery procedure Tubal ligation - 05/01/1979   Past Medical History Reviewed Past Medical History Anemia: Y Anxiety Disorder: Y Depression: Y GERD/Reflux: Y Headaches: Y High Cholesterol: Y Hypertension: Y Joint Pain: Y Osteoarthritis: Y Osteoporosis: Y Previous Fracture(s): Y Previous Oral Steroid(s): Y Seizures/Epilepsy: Y Weight loss: Y Notes: Macular Degeneration,  Impaired Vision,  Osteoporosis,  Menopause,  Childhood Measles,  Childhood Mumps   HPI The patient is here today for a  pre-operative History and Physical. They are scheduled for left total hip replacement on 07-17-2017 with Dr. Lequita Halt at [facility]. Patient is scheduled for a left total hip by Dr. Lequita Halt on 07/17/2017 at [facility]. [name] was seen for a second opinion for her left hip. She has a history of lumbar pain that has been treated by Dr. Edmon Crape. She states that she started having some pain in her hip in August. She reports that the majority of her hip pain is lateral. She did have some previous groin pain, but that resolved on it's own. She has not had any previous treatment for the hip, but does report that she has been on medication for osteoporosis for several years, after suffering from multiple fractures with really no injury. She is taking a small amount of hydrocodone for pain. She states that she cuts the tablets in fourths, and usually only takes it twice a day. She had an X-ray in November, followed by an MRI. These were done at [facility], and showed evidence of AVN. She was scheduled for evaluation of her hip by Dr. Thamas Jaegers for a surgical discussion. She stated that she would rather have her surgery performed here in [place]. She was found to have avascular necrosis of the LEFT hip and also secondarily has arthritic changes. She has severe pain and dysfunction with this. At this point the most predictable means of improving pain and function will be total hip arthroplasty. We did discuss the procedure risks potential complications and rehab course in detail and she would like to go ahead and proceed. She was originally set up for surgery earlier this year but has to postpone due to anemia. She has undergone workup and has since been cleared to proceed with surgery at this time.   ROS Constitutional: Constitutional: no fever, chills, night sweats, or significant weight loss.  Cardiovascular: Cardiovascular: no palpitations or chest pain.  Respiratory: Respiratory:  no cough or shortness of breath and No COPD.  Gastrointestinal: Gastrointestinal: no vomiting and nausea.  Musculoskeletal: Musculoskeletal: Joint Pain and back pain.  Neurologic: Neurologic: no numbness, tingling, or difficulty with balance.   Physical Exam Patient is a 67 year old female.  General Mental Status - Alert, cooperative and good historian. General Appearance - pleasant, Not in acute distress but mildly anxious at time of exam Orientation - Oriented X3. Build & Nutrition - Well nourished and petite female  Head and Neck Head - normocephalic, atraumatic . Neck Global Assessment - supple, no bruit auscultated on the right, no bruit auscultated on the left.  Eye Wears Glasses Pupil - Bilateral - PERR Motion - Bilateral - EOMI.  Chest and Lung Exam Auscultation Breath sounds - clear at anterior chest wall and clear at posterior chest wall. Adventitious sounds - No Adventitious sounds.  Cardiovascular Auscultation Rhythm - Regular rate and rhythm. Heart Sounds - S1 WNL and S2 WNL. Murmurs & Other Heart Sounds - Auscultation of the heart reveals - No Murmurs.  Abdomen Palpation/Percussion Tenderness - Abdomen is non-tender to palpation. Abdomen is soft. Auscultation Auscultation of the abdomen reveals - Bowel sounds normal.  Genitourinary Note: Not done, not pertinent to present illness  Musculoskeletal RIGHT hip can be flexed to 120, rotated and 40, out 40 and abduct to 50 without discomfort. Her LEFT hip can be flexed to 110, rotation and 20, about 30 and abduct  to 30 with discomfort. She has no tenderness on either greater trochanter. She has a significant antalgic gait pattern on the LEFT. Pulses sensation and motor are intact.  Radiographs-AP pelvis and lateral of the LEFT hip show significant joint space narrowing on the LEFT. She may have a small area of collapse centrally in the femoral head.  Review of her MRI scan shows significant edema throughout  the femoral head neck with an area of collapse of the femoral head. Her RIGHT hip looks normal.   Assessment / Plan 1. 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 Riley Nearing - seen and cleared to proceed with surgery IV TXA Anesthesia Issues: None Patient was instructed on what medications to stop prior to surgery. - Follow up visit in 2 weeks with Dr. Lequita Halt - Begin physical therapy following surgery - Pre-operative lab work as pre Pre-Surgical Testing - Prescriptions will be provided in hospital at time of discharge  Return to Office Ollen Gross, MD for [facility] on 07/30/2017 at 05:00 PM  Encounter signed-off by Patrica Duel, PA-C

## 2017-07-16 NOTE — H&P (Signed)
[name], [place] [phone](67yo, F)  DOB 23-Mar-1951    Chief Complaint Left Hip Pain H&P left total hip 07/17/2017  Patient's Care Team Primary Care Provider: Bernadette HoitAFAELA AGUIAR MD: [id number], [place], [place] [id number], Ph [phone], Fax [phone] NPI: [id number] Patient's Pharmacies [id number]): [address], [place] [id number], Ph [phone], Fax [phone]   Vitals Ht: 5 ft 3 in  BP: 165/85  Pulse: 68 bpm    Allergies Reviewed Allergies ATORVASTATIN: Rash  CELEBREX: Rash  CODEINE: - Throat Swelling  DILANTIN: Rash  SHELLFISH DERIVED: - Pateint IS ABLE to use topical Beatadine  SIMVASTATIN: Rash  TORADOL: Hallucinations, Seizure  TRAMADOL: Hallucinations, Seizure  TRAZODONE: Seizure    Medications Reviewed Medications amLODIPine 10 mg tablet Take 1 tablet(s) every day by oral route. 05/20/17   entered [name] biotin 05/20/17   entered [name] calcium 05/20/17   entered [name] clonazePAM 0.25 mg disintegrating tablet TAKE 1 TABLET BY MOUTH AS NEEDED FOR ANXIETY 11/22/16   filled surescripts Compazine 15 mg daily 05/20/17   entered [employer] Devices[employer]ith Brown Ferus 85 mg daily Internal Note: Iron Capsule 06/14/17   entered Jlynn Langille, PA-C Fish Oil 06/14/17   entered Neville Pauls, PA-C gabapentin 100 mg capsule 3 capsules at night 07/16/17   entered Zaquan Duffner, PA-C Hair, Skin and Nails Advanced 06/14/17   entered Quindarrius Joplin, PA-C HYDROcodone 5 mg-acetaminophen 325 mg tablet TAKE 1 TABLET BY MOUTH EVERY 8 HOURS AS NEEDED 06/11/17   filled [employer] Klor-Con M20 mEq tablet,extended release TAKE 1 TABLET (20 MEQ TOTAL) BY MOUTH 2 TIMES DAILY. 06/27/17   filled [employer] magnesium 06/14/17   entered Shamel Germond, PA-C meclizine 25 mg tablet TAKE 1 TABLET BY MOUTH DAILY AS NEEDED 11/22/16   filled surescripts melatonin 06/14/17   entered Pastor Sgro,  PA-C meloxicam 15 mg tablet TAKE 1 TABLET BY MOUTH EVERY DAY 05/23/17   filled [employer] pravastatin 40 mg tablet Total of 60 mg daily 07/16/17   entered Carmen Vallecillo, PA-C prochlorperazine maleate 10 mg tablet TAKE 1 TABLET BY MOUTH EVERY 6 HOURS AS NEEDED 07/15/17   filled [employer] Internal Note: eye supplement 05/23/17   entered [name] sucralfate 1 gram tablet TAKE 1 TABLET BY MOUTH 4 TIMES A DAY 06/05/17   filled [employer] topiramate 100 mg tablet TAKE 1 TABLET BY MOUTH FOUR TIMES A DAY 07/05/17   filled [employer] Vitamin D3 05/20/17   entered [name] zinc 06/14/17   entered Nahom Carfagno, PA-C    Family History Reviewed Family History Father - Father deceased   - Family history of malignant neoplasm of lung   - History of hypertension Mother - Mother deceased   - History of malignant neoplasm of liver   Social History Reviewed Social History Smoking Status: Never smoker Non-smoker Tobacco-years of use: 0 Chewing tobacco: none Alcohol intake: None Hand Dominance: Right Work related injury?: N Advance directive: Y (Notes: Living Will) Live alone or with others?: alone Marital status: Divorced   Surgical History Reviewed Surgical History Eye surgery procedure Cholecystectomy - 2017 Extraction of wisdom tooth Strabismus surgery Augmentation mammoplasty Stomach surgery procedure Tubal ligation - 05/01/1979   Past Medical History Reviewed Past Medical History Anemia: Y Anxiety Disorder: Y Depression: Y GERD/Reflux: Y Headaches: Y High Cholesterol: Y Hypertension: Y Joint Pain: Y Osteoarthritis: Y Osteoporosis: Y Previous Fracture(s): Y Previous Oral Steroid(s): Y Seizures/Epilepsy: Y Weight loss: Y Notes: Macular Degeneration,  Impaired Vision,  Osteoporosis,  Menopause,  Childhood Measles,  Childhood Mumps   HPI The patient is here today for a  pre-operative History and Physical. They are scheduled for left total hip replacement on 07-17-2017 with Dr. Lequita Halt at [facility]. Patient is scheduled for a left total hip by Dr. Lequita Halt on 07/17/2017 at [facility]. [name] was seen for a second opinion for her left hip. She has a history of lumbar pain that has been treated by Dr. Edmon Crape. She states that she started having some pain in her hip in August. She reports that the majority of her hip pain is lateral. She did have some previous groin pain, but that resolved on it's own. She has not had any previous treatment for the hip, but does report that she has been on medication for osteoporosis for several years, after suffering from multiple fractures with really no injury. She is taking a small amount of hydrocodone for pain. She states that she cuts the tablets in fourths, and usually only takes it twice a day. She had an X-ray in November, followed by an MRI. These were done at [facility], and showed evidence of AVN. She was scheduled for evaluation of her hip by Dr. Thamas Jaegers for a surgical discussion. She stated that she would rather have her surgery performed here in [place]. She was found to have avascular necrosis of the LEFT hip and also secondarily has arthritic changes. She has severe pain and dysfunction with this. At this point the most predictable means of improving pain and function will be total hip arthroplasty. We did discuss the procedure risks potential complications and rehab course in detail and she would like to go ahead and proceed. She was originally set up for surgery earlier this year but has to postpone due to anemia. She has undergone workup and has since been cleared to proceed with surgery at this time.   ROS Constitutional: Constitutional: no fever, chills, night sweats, or significant weight loss.  Cardiovascular: Cardiovascular: no palpitations or chest pain.  Respiratory: Respiratory:  no cough or shortness of breath and No COPD.  Gastrointestinal: Gastrointestinal: no vomiting and nausea.  Musculoskeletal: Musculoskeletal: Joint Pain and back pain.  Neurologic: Neurologic: no numbness, tingling, or difficulty with balance.   Physical Exam Patient is a 67 year old female.  General Mental Status - Alert, cooperative and good historian. General Appearance - pleasant, Not in acute distress but mildly anxious at time of exam Orientation - Oriented X3. Build & Nutrition - Well nourished and petite female  Head and Neck Head - normocephalic, atraumatic . Neck Global Assessment - supple, no bruit auscultated on the right, no bruit auscultated on the left.  Eye Wears Glasses Pupil - Bilateral - PERR Motion - Bilateral - EOMI.  Chest and Lung Exam Auscultation Breath sounds - clear at anterior chest wall and clear at posterior chest wall. Adventitious sounds - No Adventitious sounds.  Cardiovascular Auscultation Rhythm - Regular rate and rhythm. Heart Sounds - S1 WNL and S2 WNL. Murmurs & Other Heart Sounds - Auscultation of the heart reveals - No Murmurs.  Abdomen Palpation/Percussion Tenderness - Abdomen is non-tender to palpation. Abdomen is soft. Auscultation Auscultation of the abdomen reveals - Bowel sounds normal.  Genitourinary Note: Not done, not pertinent to present illness  Musculoskeletal RIGHT hip can be flexed to 120, rotated and 40, out 40 and abduct to 50 without discomfort. Her LEFT hip can be flexed to 110, rotation and 20, about 30 and abduct  to 30 with discomfort. She has no tenderness on either greater trochanter. She has a significant antalgic gait pattern on the LEFT. Pulses sensation and motor are intact.  Radiographs-AP pelvis and lateral of the LEFT hip show significant joint space narrowing on the LEFT. She may have a small area of collapse centrally in the femoral head.  Review of her MRI scan shows significant edema throughout  the femoral head neck with an area of collapse of the femoral head. Her RIGHT hip looks normal.   Assessment / Plan 1. 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 Riley Nearing - seen and cleared to proceed with surgery IV TXA Anesthesia Issues: None Patient was instructed on what medications to stop prior to surgery. - Follow up visit in 2 weeks with Dr. Lequita Halt - Begin physical therapy following surgery - Pre-operative lab work as pre Pre-Surgical Testing - Prescriptions will be provided in hospital at time of discharge  Return to Office Ollen Gross, MD for [facility] on 07/30/2017 at 05:00 PM  Encounter signed-off by Patrica Duel, PA-C

## 2017-07-17 ENCOUNTER — Inpatient Hospital Stay (HOSPITAL_COMMUNITY): Payer: Medicare Other | Admitting: Anesthesiology

## 2017-07-17 ENCOUNTER — Encounter (HOSPITAL_COMMUNITY)
Admission: RE | Disposition: A | Discharge status: Home Health | Payer: Self-pay | Source: Ambulatory Visit | Attending: Orthopedic Surgery

## 2017-07-17 ENCOUNTER — Inpatient Hospital Stay (HOSPITAL_COMMUNITY): Payer: Medicare Other

## 2017-07-17 ENCOUNTER — Other Ambulatory Visit: Payer: Self-pay

## 2017-07-17 ENCOUNTER — Inpatient Hospital Stay (HOSPITAL_COMMUNITY)
Admission: RE | Admit: 2017-07-17 | Discharge: 2017-07-19 | DRG: 470 | Disposition: A | Discharge status: Home Health | Payer: Medicare Other | Source: Ambulatory Visit | Attending: Orthopedic Surgery | Admitting: Orthopedic Surgery

## 2017-07-17 ENCOUNTER — Encounter (HOSPITAL_COMMUNITY): Payer: Self-pay | Admitting: *Deleted

## 2017-07-17 DIAGNOSIS — M169 Osteoarthritis of hip, unspecified: Secondary | ICD-10-CM | POA: Diagnosis present

## 2017-07-17 DIAGNOSIS — M87052 Idiopathic aseptic necrosis of left femur: Secondary | ICD-10-CM | POA: Diagnosis present

## 2017-07-17 DIAGNOSIS — Z801 Family history of malignant neoplasm of trachea, bronchus and lung: Secondary | ICD-10-CM | POA: Diagnosis not present

## 2017-07-17 DIAGNOSIS — F419 Anxiety disorder, unspecified: Secondary | ICD-10-CM | POA: Diagnosis present

## 2017-07-17 DIAGNOSIS — M810 Age-related osteoporosis without current pathological fracture: Secondary | ICD-10-CM | POA: Diagnosis present

## 2017-07-17 DIAGNOSIS — K219 Gastro-esophageal reflux disease without esophagitis: Secondary | ICD-10-CM | POA: Diagnosis present

## 2017-07-17 DIAGNOSIS — G40909 Epilepsy, unspecified, not intractable, without status epilepticus: Secondary | ICD-10-CM | POA: Diagnosis present

## 2017-07-17 DIAGNOSIS — Z8249 Family history of ischemic heart disease and other diseases of the circulatory system: Secondary | ICD-10-CM

## 2017-07-17 DIAGNOSIS — M1612 Unilateral primary osteoarthritis, left hip: Secondary | ICD-10-CM | POA: Diagnosis present

## 2017-07-17 DIAGNOSIS — D649 Anemia, unspecified: Secondary | ICD-10-CM | POA: Diagnosis present

## 2017-07-17 DIAGNOSIS — Z800 Family history of malignant neoplasm of digestive organs: Secondary | ICD-10-CM

## 2017-07-17 DIAGNOSIS — Z91013 Allergy to seafood: Secondary | ICD-10-CM

## 2017-07-17 DIAGNOSIS — Z888 Allergy status to other drugs, medicaments and biological substances status: Secondary | ICD-10-CM | POA: Diagnosis not present

## 2017-07-17 DIAGNOSIS — I10 Essential (primary) hypertension: Secondary | ICD-10-CM | POA: Diagnosis present

## 2017-07-17 DIAGNOSIS — Z8619 Personal history of other infectious and parasitic diseases: Secondary | ICD-10-CM

## 2017-07-17 DIAGNOSIS — Z8711 Personal history of peptic ulcer disease: Secondary | ICD-10-CM

## 2017-07-17 DIAGNOSIS — E7800 Pure hypercholesterolemia, unspecified: Secondary | ICD-10-CM | POA: Diagnosis present

## 2017-07-17 DIAGNOSIS — Z885 Allergy status to narcotic agent status: Secondary | ICD-10-CM | POA: Diagnosis not present

## 2017-07-17 DIAGNOSIS — Z96649 Presence of unspecified artificial hip joint: Secondary | ICD-10-CM

## 2017-07-17 DIAGNOSIS — F329 Major depressive disorder, single episode, unspecified: Secondary | ICD-10-CM | POA: Diagnosis present

## 2017-07-17 HISTORY — PX: TOTAL HIP ARTHROPLASTY: SHX124

## 2017-07-17 LAB — TYPE AND SCREEN
ABO/RH(D): O NEG
ANTIBODY SCREEN: NEGATIVE

## 2017-07-17 SURGERY — ARTHROPLASTY, HIP, TOTAL, ANTERIOR APPROACH
Anesthesia: General | Site: Hip | Laterality: Left

## 2017-07-17 MED ORDER — SUGAMMADEX SODIUM 200 MG/2ML IV SOLN
INTRAVENOUS | Status: AC
  Filled 2017-07-17: qty 2

## 2017-07-17 MED ORDER — LABETALOL HCL 5 MG/ML IV SOLN
5.0000 mg | INTRAVENOUS | Status: AC | PRN
  Administered 2017-07-17: 5 mg via INTRAVENOUS

## 2017-07-17 MED ORDER — BISACODYL 10 MG RE SUPP: 10.0000 mg | Freq: Every day | RECTAL | Status: DC | PRN

## 2017-07-17 MED ORDER — HYDRALAZINE HCL 20 MG/ML IJ SOLN
5.0000 mg | INTRAMUSCULAR | Status: DC | PRN
  Administered 2017-07-17: 5 mg via INTRAVENOUS

## 2017-07-17 MED ORDER — FENTANYL CITRATE (PF) 100 MCG/2ML IJ SOLN
INTRAMUSCULAR | Status: DC | PRN
  Administered 2017-07-17: 100 ug via INTRAVENOUS
  Administered 2017-07-17 (×2): 50 ug via INTRAVENOUS

## 2017-07-17 MED ORDER — MIDAZOLAM HCL 5 MG/5ML IJ SOLN
INTRAMUSCULAR | Status: DC | PRN
  Administered 2017-07-17: 2 mg via INTRAVENOUS

## 2017-07-17 MED ORDER — LABETALOL HCL 5 MG/ML IV SOLN
INTRAVENOUS | Status: AC
  Administered 2017-07-17: 5 mg via INTRAVENOUS
  Filled 2017-07-17: qty 4

## 2017-07-17 MED ORDER — DEXAMETHASONE SODIUM PHOSPHATE 10 MG/ML IJ SOLN
10.0000 mg | Freq: Once | INTRAMUSCULAR | Status: AC
  Administered 2017-07-17: 10 mg via INTRAVENOUS

## 2017-07-17 MED ORDER — PROMETHAZINE HCL 25 MG/ML IJ SOLN
INTRAMUSCULAR | Status: AC
  Filled 2017-07-17: qty 1

## 2017-07-17 MED ORDER — PROPOFOL 10 MG/ML IV BOLUS
INTRAVENOUS | Status: DC | PRN
  Administered 2017-07-17: 120 mg via INTRAVENOUS

## 2017-07-17 MED ORDER — OXYCODONE HCL 5 MG PO TABS
10.0000 mg | ORAL_TABLET | ORAL | Status: DC | PRN
  Administered 2017-07-18: 10:00:00 15 mg via ORAL
  Administered 2017-07-18: 10 mg via ORAL
  Administered 2017-07-18 – 2017-07-19 (×2): 15 mg via ORAL
  Filled 2017-07-17: qty 2
  Filled 2017-07-17 (×2): qty 3
  Filled 2017-07-17: qty 2
  Filled 2017-07-17: qty 3

## 2017-07-17 MED ORDER — GABAPENTIN 300 MG PO CAPS
300.0000 mg | ORAL_CAPSULE | Freq: Every day | ORAL | Status: DC
Start: 2017-07-17 — End: 2017-07-19
  Administered 2017-07-17 – 2017-07-18 (×2): 300 mg via ORAL
  Filled 2017-07-17 (×2): qty 1

## 2017-07-17 MED ORDER — FENTANYL CITRATE (PF) 250 MCG/5ML IJ SOLN
INTRAMUSCULAR | Status: AC
Start: 2017-07-17 — End: 2017-07-17
  Filled 2017-07-17: qty 5

## 2017-07-17 MED ORDER — ONDANSETRON HCL 4 MG PO TABS: 4.0000 mg | ORAL_TABLET | Freq: Four times a day (QID) | ORAL | Status: DC | PRN

## 2017-07-17 MED ORDER — SUGAMMADEX SODIUM 200 MG/2ML IV SOLN
INTRAVENOUS | Status: DC | PRN
  Administered 2017-07-17: 100 mg via INTRAVENOUS

## 2017-07-17 MED ORDER — LIDOCAINE HCL (CARDIAC) 20 MG/ML IV SOLN
INTRAVENOUS | Status: DC | PRN
  Administered 2017-07-17: 50 mg via INTRAVENOUS

## 2017-07-17 MED ORDER — FERROUS SULFATE 325 (65 FE) MG PO TABS
325.0000 mg | ORAL_TABLET | Freq: Every day | ORAL | Status: DC
  Administered 2017-07-18 – 2017-07-19 (×2): 325 mg via ORAL
  Filled 2017-07-17 (×2): qty 1

## 2017-07-17 MED ORDER — TRANEXAMIC ACID 1000 MG/10ML IV SOLN
1000.0000 mg | Freq: Once | INTRAVENOUS | Status: AC
  Administered 2017-07-17: 1000 mg via INTRAVENOUS
  Filled 2017-07-17: qty 1100

## 2017-07-17 MED ORDER — ROCURONIUM BROMIDE 10 MG/ML (PF) SYRINGE
PREFILLED_SYRINGE | INTRAVENOUS | Status: AC
  Filled 2017-07-17: qty 5

## 2017-07-17 MED ORDER — PHENOL 1.4 % MT LIQD: 1.0000 | OROMUCOSAL | Status: DC | PRN

## 2017-07-17 MED ORDER — MIDAZOLAM HCL 2 MG/2ML IJ SOLN
INTRAMUSCULAR | Status: AC
  Filled 2017-07-17: qty 2

## 2017-07-17 MED ORDER — TOPIRAMATE 100 MG PO TABS: 100.0000 mg | ORAL_TABLET | Freq: Four times a day (QID) | ORAL | Status: DC

## 2017-07-17 MED ORDER — FLEET ENEMA 7-19 GM/118ML RE ENEM: 1.0000 | ENEMA | Freq: Once | RECTAL | Status: DC | PRN

## 2017-07-17 MED ORDER — TRANEXAMIC ACID 1000 MG/10ML IV SOLN: 1000.0000 mg | INTRAVENOUS | Status: DC

## 2017-07-17 MED ORDER — DEXAMETHASONE SODIUM PHOSPHATE 10 MG/ML IJ SOLN
10.0000 mg | Freq: Once | INTRAMUSCULAR | Status: AC
  Administered 2017-07-18: 10:00:00 10 mg via INTRAVENOUS
  Filled 2017-07-17: qty 1

## 2017-07-17 MED ORDER — OXYCODONE HCL 5 MG PO TABS
5.0000 mg | ORAL_TABLET | ORAL | Status: DC | PRN
  Administered 2017-07-18: 16:00:00 10 mg via ORAL
  Administered 2017-07-18: 02:00:00 5 mg via ORAL
  Administered 2017-07-19: 14:00:00 10 mg via ORAL
  Administered 2017-07-19: 5 mg via ORAL
  Filled 2017-07-17 (×2): qty 2
  Filled 2017-07-17: qty 1
  Filled 2017-07-17: qty 2

## 2017-07-17 MED ORDER — ONDANSETRON HCL 4 MG/2ML IJ SOLN
INTRAMUSCULAR | Status: AC
  Filled 2017-07-17: qty 2

## 2017-07-17 MED ORDER — BUPIVACAINE HCL (PF) 0.25 % IJ SOLN
INTRAMUSCULAR | Status: AC
  Filled 2017-07-17: qty 30

## 2017-07-17 MED ORDER — METHOCARBAMOL 1000 MG/10ML IJ SOLN
500.0000 mg | Freq: Four times a day (QID) | INTRAMUSCULAR | Status: DC | PRN
  Administered 2017-07-17: 500 mg via INTRAVENOUS
  Filled 2017-07-17: qty 550

## 2017-07-17 MED ORDER — CEFAZOLIN SODIUM-DEXTROSE 2-3 GM-%(50ML) IV SOLR
INTRAVENOUS | Status: DC | PRN
  Administered 2017-07-17: 2 g via INTRAVENOUS

## 2017-07-17 MED ORDER — ONDANSETRON HCL 4 MG/2ML IJ SOLN: 4.0000 mg | Freq: Four times a day (QID) | INTRAMUSCULAR | Status: DC | PRN

## 2017-07-17 MED ORDER — ACETAMINOPHEN 10 MG/ML IV SOLN
1000.0000 mg | Freq: Once | INTRAVENOUS | Status: AC
  Administered 2017-07-17: 1000 mg via INTRAVENOUS
  Filled 2017-07-17: qty 100

## 2017-07-17 MED ORDER — POTASSIUM CHLORIDE 20 MEQ PO PACK: 20.0000 meq | PACK | Freq: Two times a day (BID) | ORAL | Status: DC

## 2017-07-17 MED ORDER — CHLORHEXIDINE GLUCONATE 4 % EX LIQD: 60.0000 mL | Freq: Once | CUTANEOUS | Status: DC

## 2017-07-17 MED ORDER — METOCLOPRAMIDE HCL 5 MG PO TABS: 5.0000 mg | ORAL_TABLET | Freq: Three times a day (TID) | ORAL | Status: DC | PRN

## 2017-07-17 MED ORDER — MECLIZINE HCL 25 MG PO TABS: 25.0000 mg | ORAL_TABLET | Freq: Three times a day (TID) | ORAL | Status: DC | PRN

## 2017-07-17 MED ORDER — LACTATED RINGERS IV SOLN
INTRAVENOUS | Status: DC
  Administered 2017-07-17: 09:00:00 via INTRAVENOUS

## 2017-07-17 MED ORDER — CEFAZOLIN SODIUM-DEXTROSE 1-4 GM/50ML-% IV SOLN
1.0000 g | Freq: Four times a day (QID) | INTRAVENOUS | Status: AC
  Administered 2017-07-17 (×2): 1 g via INTRAVENOUS
  Filled 2017-07-17 (×2): qty 50

## 2017-07-17 MED ORDER — SODIUM CHLORIDE 0.9 % IV SOLN
INTRAVENOUS | Status: DC
  Administered 2017-07-17: 1000 mL via INTRAVENOUS
  Administered 2017-07-17: 21:00:00 via INTRAVENOUS

## 2017-07-17 MED ORDER — FERROUS SULFATE DRIED ER 160 (50 FE) MG PO TBCR: 85.0000 mg | EXTENDED_RELEASE_TABLET | Freq: Every day | ORAL | Status: DC

## 2017-07-17 MED ORDER — PROCHLORPERAZINE MALEATE 10 MG PO TABS: 10.0000 mg | ORAL_TABLET | Freq: Four times a day (QID) | ORAL | Status: DC | PRN

## 2017-07-17 MED ORDER — METHOCARBAMOL 500 MG PO TABS
500.0000 mg | ORAL_TABLET | Freq: Four times a day (QID) | ORAL | Status: DC | PRN
  Administered 2017-07-18 – 2017-07-19 (×4): 500 mg via ORAL
  Filled 2017-07-17 (×4): qty 1

## 2017-07-17 MED ORDER — ACETAMINOPHEN 500 MG PO TABS
1000.0000 mg | ORAL_TABLET | Freq: Four times a day (QID) | ORAL | Status: AC
  Administered 2017-07-17 – 2017-07-18 (×4): 1000 mg via ORAL
  Filled 2017-07-17 (×4): qty 2

## 2017-07-17 MED ORDER — HYDROMORPHONE HCL 1 MG/ML IJ SOLN
0.5000 mg | INTRAMUSCULAR | Status: DC | PRN
  Administered 2017-07-17 (×2): 1 mg via INTRAVENOUS
  Filled 2017-07-17 (×2): qty 1

## 2017-07-17 MED ORDER — DEXAMETHASONE SODIUM PHOSPHATE 10 MG/ML IJ SOLN
INTRAMUSCULAR | Status: AC
  Filled 2017-07-17: qty 1

## 2017-07-17 MED ORDER — POTASSIUM CHLORIDE CRYS ER 20 MEQ PO TBCR
20.0000 meq | EXTENDED_RELEASE_TABLET | Freq: Two times a day (BID) | ORAL | Status: DC
  Administered 2017-07-17: 20 meq via ORAL
  Filled 2017-07-17: qty 1

## 2017-07-17 MED ORDER — LACTATED RINGERS IV SOLN: INTRAVENOUS | Status: DC

## 2017-07-17 MED ORDER — BUPIVACAINE HCL (PF) 0.25 % IJ SOLN
INTRAMUSCULAR | Status: DC | PRN
  Administered 2017-07-17: 30 mL

## 2017-07-17 MED ORDER — PROPOFOL 10 MG/ML IV BOLUS
INTRAVENOUS | Status: AC
  Filled 2017-07-17: qty 40

## 2017-07-17 MED ORDER — LABETALOL HCL 5 MG/ML IV SOLN: 10.0000 mg | INTRAVENOUS | Status: DC | PRN

## 2017-07-17 MED ORDER — DOCUSATE SODIUM 100 MG PO CAPS
100.0000 mg | ORAL_CAPSULE | Freq: Two times a day (BID) | ORAL | Status: DC
  Administered 2017-07-17 – 2017-07-19 (×4): 100 mg via ORAL
  Filled 2017-07-17 (×4): qty 1

## 2017-07-17 MED ORDER — LIP MEDEX EX OINT
TOPICAL_OINTMENT | CUTANEOUS | Status: AC
  Filled 2017-07-17: qty 7

## 2017-07-17 MED ORDER — POLYETHYLENE GLYCOL 3350 17 G PO PACK
17.0000 g | PACK | Freq: Every day | ORAL | Status: DC | PRN
  Administered 2017-07-19: 17 g via ORAL
  Filled 2017-07-17: qty 1

## 2017-07-17 MED ORDER — FENTANYL CITRATE (PF) 100 MCG/2ML IJ SOLN
INTRAMUSCULAR | Status: AC
  Administered 2017-07-17: 50 ug via INTRAVENOUS
  Filled 2017-07-17: qty 4

## 2017-07-17 MED ORDER — METOCLOPRAMIDE HCL 5 MG/ML IJ SOLN
5.0000 mg | Freq: Three times a day (TID) | INTRAMUSCULAR | Status: DC | PRN
  Administered 2017-07-17: 10 mg via INTRAVENOUS
  Filled 2017-07-17: qty 2

## 2017-07-17 MED ORDER — MENTHOL 3 MG MT LOZG: 1.0000 | LOZENGE | OROMUCOSAL | Status: DC | PRN

## 2017-07-17 MED ORDER — PRAVASTATIN SODIUM 20 MG PO TABS
60.0000 mg | ORAL_TABLET | Freq: Every day | ORAL | Status: DC
  Administered 2017-07-17 – 2017-07-18 (×2): 60 mg via ORAL
  Filled 2017-07-17 (×2): qty 3

## 2017-07-17 MED ORDER — RIVAROXABAN 10 MG PO TABS
10.0000 mg | ORAL_TABLET | Freq: Every day | ORAL | Status: DC
  Administered 2017-07-18 – 2017-07-19 (×2): 10 mg via ORAL
  Filled 2017-07-17 (×2): qty 1

## 2017-07-17 MED ORDER — HYDRALAZINE HCL 20 MG/ML IJ SOLN
INTRAMUSCULAR | Status: AC
  Filled 2017-07-17: qty 1

## 2017-07-17 MED ORDER — STERILE WATER FOR IRRIGATION IR SOLN
Status: DC | PRN
  Administered 2017-07-17: 2000 mL

## 2017-07-17 MED ORDER — CLONAZEPAM 0.125 MG PO TBDP
0.0625 mg | ORAL_TABLET | Freq: Two times a day (BID) | ORAL | Status: DC
  Administered 2017-07-18 – 2017-07-19 (×2): 0.0625 mg via ORAL
  Filled 2017-07-17 (×2): qty 1

## 2017-07-17 MED ORDER — ACETAMINOPHEN 325 MG PO TABS
325.0000 mg | ORAL_TABLET | Freq: Four times a day (QID) | ORAL | Status: DC | PRN
  Administered 2017-07-18 – 2017-07-19 (×2): 650 mg via ORAL
  Filled 2017-07-17 (×2): qty 2

## 2017-07-17 MED ORDER — SODIUM CHLORIDE 0.9 % IR SOLN
Status: DC | PRN
Start: 2017-07-17 — End: 2017-07-17
  Administered 2017-07-17: 1000 mL

## 2017-07-17 MED ORDER — ONDANSETRON HCL 4 MG/2ML IJ SOLN
INTRAMUSCULAR | Status: DC | PRN
  Administered 2017-07-17: 4 mg via INTRAVENOUS

## 2017-07-17 MED ORDER — PROMETHAZINE HCL 25 MG/ML IJ SOLN
6.2500 mg | INTRAMUSCULAR | Status: DC | PRN
  Administered 2017-07-17: 6.25 mg via INTRAVENOUS

## 2017-07-17 MED ORDER — HYDRALAZINE HCL 20 MG/ML IJ SOLN: 5.0000 mg | Freq: Once | INTRAMUSCULAR | Status: DC

## 2017-07-17 MED ORDER — FENTANYL CITRATE (PF) 100 MCG/2ML IJ SOLN
25.0000 ug | INTRAMUSCULAR | Status: DC | PRN
  Administered 2017-07-17 (×2): 50 ug via INTRAVENOUS
  Administered 2017-07-17 (×2): 25 ug via INTRAVENOUS

## 2017-07-17 MED ORDER — LIDOCAINE 2% (20 MG/ML) 5 ML SYRINGE
INTRAMUSCULAR | Status: AC
  Filled 2017-07-17: qty 5

## 2017-07-17 MED ORDER — SUCRALFATE 1 G PO TABS
1.0000 g | ORAL_TABLET | Freq: Two times a day (BID) | ORAL | Status: DC
  Administered 2017-07-17 – 2017-07-19 (×3): 1 g via ORAL
  Filled 2017-07-17 (×4): qty 1

## 2017-07-17 MED ORDER — ROCURONIUM BROMIDE 10 MG/ML (PF) SYRINGE
PREFILLED_SYRINGE | INTRAVENOUS | Status: DC | PRN
  Administered 2017-07-17: 30 mg via INTRAVENOUS
  Administered 2017-07-17: 20 mg via INTRAVENOUS

## 2017-07-17 MED ORDER — GABAPENTIN 100 MG PO CAPS: 100.0000 mg | ORAL_CAPSULE | Freq: Every day | ORAL | Status: DC | PRN

## 2017-07-17 MED ORDER — DIPHENHYDRAMINE HCL 12.5 MG/5ML PO ELIX: 12.5000 mg | ORAL_SOLUTION | ORAL | Status: DC | PRN

## 2017-07-17 MED ORDER — AMLODIPINE BESYLATE 10 MG PO TABS
10.0000 mg | ORAL_TABLET | Freq: Every day | ORAL | Status: DC
  Administered 2017-07-18 – 2017-07-19 (×2): 10 mg via ORAL
  Filled 2017-07-17 (×2): qty 1

## 2017-07-17 MED ORDER — TOPIRAMATE 100 MG PO TABS
100.0000 mg | ORAL_TABLET | Freq: Four times a day (QID) | ORAL | Status: DC
  Administered 2017-07-17 – 2017-07-19 (×10): 100 mg via ORAL
  Filled 2017-07-17 (×10): qty 1

## 2017-07-17 MED ORDER — CEFAZOLIN SODIUM-DEXTROSE 2-4 GM/100ML-% IV SOLN
2.0000 g | INTRAVENOUS | Status: DC
  Filled 2017-07-17: qty 100

## 2017-07-17 MED ORDER — LABETALOL HCL 5 MG/ML IV SOLN
INTRAVENOUS | Status: DC | PRN
  Administered 2017-07-17: 2.5 mg via INTRAVENOUS
  Administered 2017-07-17: 5 mg via INTRAVENOUS
  Administered 2017-07-17: 2.5 mg via INTRAVENOUS

## 2017-07-17 SURGICAL SUPPLY — 33 items
BAG DECANTER FOR FLEXI CONT (MISCELLANEOUS) ×2 IMPLANT
BAG ZIPLOCK 12X15 (MISCELLANEOUS) IMPLANT
BLADE SAG 18X100X1.27 (BLADE) ×2 IMPLANT
CAPT HIP TOTAL 2 ×2 IMPLANT
CLOTH BEACON ORANGE TIMEOUT ST (SAFETY) ×2 IMPLANT
COVER PERINEAL POST (MISCELLANEOUS) ×2 IMPLANT
COVER SURGICAL LIGHT HANDLE (MISCELLANEOUS) ×2 IMPLANT
DECANTER SPIKE VIAL GLASS SM (MISCELLANEOUS) ×2 IMPLANT
DRAPE STERI IOBAN 125X83 (DRAPES) ×2 IMPLANT
DRAPE U-SHAPE 47X51 STRL (DRAPES) ×4 IMPLANT
DRSG ADAPTIC 3X8 NADH LF (GAUZE/BANDAGES/DRESSINGS) ×2 IMPLANT
DRSG MEPILEX BORDER 4X4 (GAUZE/BANDAGES/DRESSINGS) ×2 IMPLANT
DRSG MEPILEX BORDER 4X8 (GAUZE/BANDAGES/DRESSINGS) ×2 IMPLANT
DURAPREP 26ML APPLICATOR (WOUND CARE) ×2 IMPLANT
ELECT REM PT RETURN 15FT ADLT (MISCELLANEOUS) ×2 IMPLANT
EVACUATOR 1/8 PVC DRAIN (DRAIN) ×2 IMPLANT
GLOVE BIO SURGEON STRL SZ7.5 (GLOVE) ×2 IMPLANT
GLOVE BIO SURGEON STRL SZ8 (GLOVE) ×4 IMPLANT
GLOVE BIOGEL PI IND STRL 8 (GLOVE) ×2 IMPLANT
GLOVE BIOGEL PI INDICATOR 8 (GLOVE) ×2
GOWN STRL REUS W/TWL LRG LVL3 (GOWN DISPOSABLE) ×2 IMPLANT
GOWN STRL REUS W/TWL XL LVL3 (GOWN DISPOSABLE) ×2 IMPLANT
PACK ANTERIOR HIP CUSTOM (KITS) ×2 IMPLANT
STRIP CLOSURE SKIN 1/2X4 (GAUZE/BANDAGES/DRESSINGS) ×2 IMPLANT
SUT ETHIBOND NAB CT1 #1 30IN (SUTURE) ×2 IMPLANT
SUT MNCRL AB 4-0 PS2 18 (SUTURE) ×2 IMPLANT
SUT STRATAFIX 0 PDS 27 VIOLET (SUTURE) ×2
SUT VIC AB 2-0 CT1 27 (SUTURE) ×2
SUT VIC AB 2-0 CT1 TAPERPNT 27 (SUTURE) ×2 IMPLANT
SUTURE STRATFX 0 PDS 27 VIOLET (SUTURE) ×1 IMPLANT
SYR 50ML LL SCALE MARK (SYRINGE) IMPLANT
TRAY FOLEY W/METER SILVER 16FR (SET/KITS/TRAYS/PACK) ×2 IMPLANT
YANKAUER SUCT BULB TIP 10FT TU (MISCELLANEOUS) ×2 IMPLANT

## 2017-07-17 NOTE — Anesthesia Postprocedure Evaluation (Signed)
Anesthesia Post Note  Patient: [name]  Procedure(s) Performed: LEFT TOTAL HIP ARTHROPLASTY ANTERIOR APPROACH (Left Hip)     Patient location during evaluation: PACU Anesthesia Type: General Level of consciousness: awake and alert Pain management: pain level controlled Vital Signs Assessment: post-procedure vital signs reviewed and stable Respiratory status: spontaneous breathing, nonlabored ventilation, respiratory function stable and patient connected to nasal cannula oxygen Cardiovascular status: blood pressure returned to baseline and stable Postop Assessment: no apparent nausea or vomiting Anesthetic complications: no    Last Vitals:  Vitals:   07/17/17 1315 07/17/17 1329  BP: (!) 160/76 (!) 190/87  Pulse:  76  Resp:  12  Temp:  36.4 C  SpO2:  100%    Last Pain:  Vitals:   07/17/17 1353  TempSrc:   PainSc: 10-Worst pain ever                 Kennieth RadFitzgerald, Hamid Brookens E

## 2017-07-17 NOTE — Transfer of Care (Signed)
Immediate Anesthesia Transfer of Care Note  Patient: Tina GuarneriSandra J Mann  Procedure(s) Performed: LEFT TOTAL HIP ARTHROPLASTY ANTERIOR APPROACH (Left Hip)  Patient Location: PACU  Anesthesia Type:General  Level of Consciousness: sedated  Airway & Oxygen Therapy: Patient Spontanous Breathing and Patient connected to face mask oxygen  Post-op Assessment: Report given to RN and Post -op Vital signs reviewed and stable  Post vital signs: Reviewed and stable  Last Vitals:  Vitals:   07/17/17 0824  BP: (!) 155/80  Pulse: 64  Resp: 18  Temp: 36.7 C  SpO2: 98%    Last Pain:  Vitals:   07/17/17 0837  TempSrc:   PainSc: 10-Worst pain ever         Complications: No apparent anesthesia complications

## 2017-07-17 NOTE — Anesthesia Procedure Notes (Signed)
Procedure Name: Intubation Date/Time: 07/17/2017 9:50 AM Performed by: Deliah Boston, CRNA Pre-anesthesia Checklist: Patient identified, Emergency Drugs available, Suction available and Patient being monitored Patient Re-evaluated:Patient Re-evaluated prior to induction Oxygen Delivery Method: Circle system utilized Preoxygenation: Pre-oxygenation with 100% oxygen Induction Type: IV induction Ventilation: Mask ventilation without difficulty Laryngoscope Size: Mac and 3 Grade View: Grade I Tube type: Oral Tube size: 7.0 mm Number of attempts: 1 Airway Equipment and Method: Stylet and Oral airway Placement Confirmation: ETT inserted through vocal cords under direct vision,  positive ETCO2 and breath sounds checked- equal and bilateral Secured at: 20 cm Tube secured with: Tape Dental Injury: Teeth and Oropharynx as per pre-operative assessment

## 2017-07-17 NOTE — Interval H&P Note (Signed)
History and Physical Interval Note:  07/17/2017 8:15 AM  [name]  has presented today for surgery, with the diagnosis of Avascular necrosis Left Hip  The various methods of treatment have been discussed with the patient and family. After consideration of risks, benefits and other options for treatment, the patient has consented to  Procedure(s): LEFT TOTAL HIP ARTHROPLASTY ANTERIOR APPROACH (Left) as a surgical intervention .  The patient's history has been reviewed, patient examined, no change in status, stable for surgery.  I have reviewed the patient's chart and labs.  Questions were answered to the patient's satisfaction.     Homero FellersFrank Allure Greaser

## 2017-07-17 NOTE — Anesthesia Preprocedure Evaluation (Addendum)
Anesthesia Evaluation  Patient identified by MRN, date of birth, ID band Patient awake    Reviewed: Allergy & Precautions, NPO status , Patient's Chart, lab work & pertinent test results  Airway Mallampati: II  TM Distance: >3 FB Neck ROM: Full    Dental  (+) Dental Advisory Given   Pulmonary neg pulmonary ROS,    breath sounds clear to auscultation       Cardiovascular hypertension, Pt. on medications  Rhythm:Regular Rate:Normal     Neuro/Psych Seizures -,  Anxiety Depression    GI/Hepatic Neg liver ROS, PUD, GERD  Medicated,  Endo/Other  negative endocrine ROS  Renal/GU negative Renal ROS     Musculoskeletal  (+) Arthritis ,   Abdominal   Peds  Hematology  (+) anemia ,   Anesthesia Other Findings   Reproductive/Obstetrics                            Lab Results  Component Value Date   WBC 4.8 11/24/2016   HGB 10.7 (L) 11/24/2016   HCT 31.6 (L) 11/24/2016   MCV 77.6 (L) 11/24/2016   PLT 194 11/24/2016   Lab Results  Component Value Date   CREATININE 0.72 07/12/2017   BUN 16 07/12/2017   NA 132 (L) 07/12/2017   K 3.4 (L) 07/12/2017   CL 98 (L) 07/12/2017   CO2 24 07/12/2017    Anesthesia Physical Anesthesia Plan  ASA: II  Anesthesia Plan: General   Post-op Pain Management:    Induction: Intravenous  PONV Risk Score and Plan: 3 and Dexamethasone, Ondansetron, Treatment may vary due to age or medical condition and Midazolam  Airway Management Planned: Oral ETT  Additional Equipment:   Intra-op Plan:   Post-operative Plan: Extubation in OR  Informed Consent: I have reviewed the patients History and Physical, chart, labs and discussed the procedure including the risks, benefits and alternatives for the proposed anesthesia with the patient or authorized representative who has indicated his/her understanding and acceptance.   Dental advisory given  Plan Discussed  with: CRNA  Anesthesia Plan Comments:        Anesthesia Quick Evaluation

## 2017-07-17 NOTE — Op Note (Signed)
OPERATIVE REPORT- TOTAL HIP ARTHROPLASTY   PREOPERATIVE DIAGNOSIS: Osteonecrosis of the Left hip.   POSTOPERATIVE DIAGNOSIS: Osteonecrosis of the Left  hip.   PROCEDURE: Left total hip arthroplasty, anterior approach.   SURGEON: Ollen GrossFrank Keili Hasten, MD   ASSISTANT: Avel Peacerew Perkins, PA-C  ANESTHESIA:  General  ESTIMATED BLOOD LOSS:-350 mL    DRAINS: Hemovac x1.   COMPLICATIONS: None   CONDITION: PACU - hemodynamically stable.   BRIEF CLINICAL NOTE: [name] is a 67 y.o. female who has advancedosteonecrosis of their Left hip with progressively worsening pain and  dysfunction.The patient has failed nonoperative management and presents for  total hip arthroplasty.   PROCEDURE IN DETAIL: After successful administration of spinal  anesthetic, the traction boots [facility] were placed on both  feet and the patient was [facility], boots placed into the leg  holders. The Left hip was then isolated from the perineum with plastic  drapes and prepped and draped in the usual sterile fashion. ASIS and  greater trochanter were marked and a oblique incision was made, starting  at about 1 cm lateral and 2 cm distal to the ASIS and coursing towards  the anterior cortex of the femur. The skin was cut with a 10 blade  through subcutaneous tissue to the level of the fascia overlying the  tensor fascia lata muscle. The fascia was then incised in line with the  incision at the junction of the anterior third and posterior 2/3rd. The  muscle was teased off the fascia and then the interval between the TFL  and the rectus was developed. The Hohmann retractor was then placed at  the top of the femoral neck over the capsule. The vessels overlying the  capsule were cauterized and the fat on top of the capsule was removed.  A Hohmann retractor was then placed anterior underneath the rectus  femoris to give exposure to the entire anterior capsule. A T-shaped  capsulotomy was  performed. The edges were tagged and the femoral head  was identified.       Osteophytes are removed off the superior acetabulum.  The femoral neck was then cut in situ with an oscillating saw. Traction  was then applied to the left lower extremity utilizing the [facility]  traction. The femoral head was then removed. Retractors were placed  around the acetabulum and then circumferential removal of the labrum was  performed. Osteophytes were also removed. Reaming starts at 45 mm to  medialize and  Increased in 2 mm increments to 47 mm. We reamed in  approximately 40 degrees of abduction, 20 degrees anteversion. A 48 mm  pinnacle acetabular shell was then impacted in anatomic position under  fluoroscopic guidance with excellent purchase. We did not need to place  any additional dome screws. A 28 mm neutral + 4 marathon liner was then  placed into the acetabular shell.       The femoral lift was then placed along the lateral aspect of the femur  just distal to the vastus ridge. The leg was  externally rotated and capsule  was stripped off the inferior aspect of the femoral neck down to the  level of the lesser trochanter, this was done with electrocautery. The femur was lifted after this was performed. The  leg was then placed in an extended and adducted position essentially delivering the femur. We also removed the capsule superiorly and the piriformis from the piriformis fossa to gain excellent exposure  of the  proximal femur. Rongeur was used to remove some cancellous bone to get  into the lateral portion of the proximal femur for placement of the  initial starter reamer. The starter broaches was placed  the starter broach  and was shown to go down the center of the canal. Broaching  with the  Corail system was then performed starting at size 8, coursing  Up to size 11. A size 11 had excellent torsional and rotational  and axial stability. The trial standard offset neck was then placed  with a 28  + 5 trial head. The hip was then reduced. We confirmed that  the stem was in the canal both on AP and lateral x-rays. It also has excellent sizing. The hip was reduced with outstanding stability through full extension and full external rotation.. AP pelvis was taken and the leg lengths were measured and found to be equal. Hip was then dislocated again and the femoral head and neck removed. The  femoral broach was removed. Size 11 Corail stem with a standard offset  neck was then impacted into the femur following native anteversion. Has  excellent purchase in the canal. Excellent torsional and rotational and  axial stability. It is confirmed to be in the canal on AP and lateral  fluoroscopic views. The 28 + 5 ceramic head was placed and the hip  reduced with outstanding stability. Again AP pelvis was taken and it  confirmed that the leg lengths were equal. The wound was then copiously  irrigated with saline solution and the capsule reattached and repaired  with Ethibond suture. 30 ml of .25% Bupivicaine was  injected into the capsule and into the edge of the tensor fascia lata as well as subcutaneous tissue. The fascia overlying the tensor fascia lata was then closed with a running #1 V-Loc. Subcu was closed with interrupted 2-0 Vicryl and subcuticular running 4-0 Monocryl. Incision was cleaned  and dried. Steri-Strips and a bulky sterile dressing applied. Hemovac  drain was hooked to suction and then the patient was awakened and transported to  recovery in stable condition.        Please note that a surgical assistant was a medical necessity for this procedure to perform it in a safe and expeditious manner. Assistant was necessary to provide appropriate retraction of vital neurovascular structures and to prevent femoral fracture and allow for anatomic placement of the prosthesis.  Ollen Gross, M.D.

## 2017-07-17 NOTE — Progress Notes (Signed)
PT Cancellation Note  Patient Details Name: Tina GuarneriSandra J Weisenberger MRN: [id number] DOB: Jul 15, 1950   Cancelled Treatment:    Reason Eval/Treat Not Completed: Pain limiting ability to participate(per RN, pt not yet ready for PT 2* pain issues. Will follow. )   Tamala SerUhlenberg, Kiefer Opheim Kistler 07/17/2017, 2:05 PM [phone]

## 2017-07-18 LAB — BASIC METABOLIC PANEL
Anion gap: 11 (ref 5–15)
Anion gap: 10 (ref 5–15)
BUN: 18 mg/dL (ref 6–20)
BUN: 19 mg/dL (ref 6–20)
CHLORIDE: 103 mmol/L (ref 101–111)
CO2: 24 mmol/L (ref 22–32)
CO2: 22 mmol/L (ref 22–32)
Calcium: 8.3 mg/dL — ABNORMAL LOW (ref 8.9–10.3)
Calcium: 8.6 mg/dL — ABNORMAL LOW (ref 8.9–10.3)
Chloride: 106 mmol/L (ref 101–111)
Creatinine, Ser: 0.85 mg/dL (ref 0.44–1.00)
Creatinine, Ser: 0.94 mg/dL (ref 0.44–1.00)
GFR calc non Af Amer: >60 mL/min (ref >60)
GFR calc non Af Amer: >60 mL/min (ref >60)
Glucose, Bld: 117 mg/dL — ABNORMAL HIGH (ref 65–99)
Glucose, Bld: 208 mg/dL — ABNORMAL HIGH (ref 65–99)
POTASSIUM: 3.1 mmol/L — AB (ref 3.5–5.1)
POTASSIUM: 4.1 mmol/L (ref 3.5–5.1)
SODIUM: 138 mmol/L (ref 135–145)
SODIUM: 138 mmol/L (ref 135–145)

## 2017-07-18 LAB — CBC
HEMATOCRIT: 32.8 % — AB (ref 36.0–46.0)
HEMOGLOBIN: 10.3 g/dL — AB (ref 12.0–15.0)
MCH: 28.4 pg (ref 26.0–34.0)
MCHC: 31.4 g/dL (ref 30.0–36.0)
MCV: 90.4 fL (ref 78.0–100.0)
Platelets: 176 10*3/uL (ref 150–400)
RBC: 3.63 MIL/uL — AB (ref 3.87–5.11)
WBC: 7.3 10*3/uL (ref 4.0–10.5)

## 2017-07-18 MED ORDER — RIVAROXABAN 10 MG PO TABS: 10.0000 mg | ORAL_TABLET | Freq: Every day | ORAL | 0 refills | Status: AC

## 2017-07-18 MED ORDER — OXYCODONE HCL 5 MG PO TABS: 5.0000 mg | ORAL_TABLET | ORAL | 0 refills | Status: AC | PRN

## 2017-07-18 MED ORDER — METHOCARBAMOL 500 MG PO TABS: 500.0000 mg | ORAL_TABLET | Freq: Four times a day (QID) | ORAL | 0 refills | Status: AC | PRN

## 2017-07-18 MED ORDER — POTASSIUM CHLORIDE CRYS ER 20 MEQ PO TBCR
40.0000 meq | EXTENDED_RELEASE_TABLET | ORAL | Status: AC
  Administered 2017-07-18 (×3): 40 meq via ORAL
  Filled 2017-07-18 (×3): qty 2

## 2017-07-18 MED ORDER — PROCHLORPERAZINE MALEATE 10 MG PO TABS
10.0000 mg | ORAL_TABLET | Freq: Four times a day (QID) | ORAL | Status: DC | PRN
  Administered 2017-07-18 – 2017-07-19 (×3): 10 mg via ORAL
  Filled 2017-07-18 (×3): qty 1

## 2017-07-18 NOTE — Progress Notes (Signed)
Physical Therapy Treatment Patient Details Name: [name] MRN: [id number] DOB: 1950-12-09 Today's Date: 07/18/2017    History of Present Illness DA THA on LEFT.    PT Comments    The patient moves slowly, son present for review of bed mobility with leg lifter and mobility, and gait. Plans Dc tomorrow.   Follow Up Recommendations  Home health PT     Equipment Recommendations  None recommended by PT    Recommendations for Other Services       Precautions / Restrictions Precautions Precautions: Fall    Mobility  Bed Mobility   Bed Mobility: Supine to Sit;Sit to Supine     Supine to sit: Min guard Sit to supine: Min guard   General bed mobility comments: patient used  belt then leg lifter to self help and practice left leg onto bed. Son bought a leg lifter for patient.  Transfers Overall transfer level: Needs assistance Equipment used: Rolling walker (2 wheeled) Transfers: Sit to/from Stand Sit to Stand: Min assist         General transfer comment: cues for hand and right leg position, some effort to sit on low surface  and stand up  Ambulation/Gait Ambulation/Gait assistance: Min guard Ambulation Distance (Feet): 80 Feet Assistive device: Rolling walker (2 wheeled) Gait Pattern/deviations: Step-to pattern     General Gait Details: cues to increase step length of right leg, cues for posture   Stairs            Wheelchair Mobility    Modified Rankin (Stroke Patients Only)       Balance                                            Cognition Arousal/Alertness: Awake/alert Behavior During Therapy: WFL for tasks assessed/performed Overall Cognitive Status: Within Functional Limits for tasks assessed                                 General Comments: patient  required constant stimulation to arouse for exercises      Exercises   General Comments        Pertinent Vitals/Pain Pain Assessment:  0-10 Pain Score: 4  Pain Location: left hip and thigh Pain Descriptors / Indicators: Discomfort;Grimacing;Guarding Pain Intervention(s): Monitored during session;Premedicated before session;Ice applied    Home Living Family/patient expects to be discharged to:: Private residence Living Arrangements: Children Available Help at Discharge: Family Type of Home: House Home Access: Stairs to enter   Home Layout: One level Home Equipment: Environmental consultant - 2 wheels;Cane - single point      Prior Function Level of Independence: Independent with assistive device(s)          PT Goals (current goals can now be found in the care plan section) Acute Rehab PT Goals Patient Stated Goal: to walk with leaning over PT Goal Formulation: With patient/family Time For Goal Achievement: 07/25/17 Potential to Achieve Goals: Good Progress towards PT goals: Progressing toward goals    Frequency    7X/week      PT Plan Current plan remains appropriate    Co-evaluation              AM-PAC PT "6 Clicks" Daily Activity  Outcome Measure  Difficulty turning over in bed (including adjusting bedclothes, sheets and blankets)?: A Little  Difficulty moving from lying on back to sitting on the side of the bed? : A Little Difficulty sitting down on and standing up from a chair with arms (e.g., wheelchair, bedside commode, etc,.)?: A Little Help needed moving to and from a bed to chair (including a wheelchair)?: A Little Help needed walking in hospital room?: A Lot Help needed climbing 3-5 steps with a railing? : A Lot 6 Click Score: 16    End of Session   Activity Tolerance: Patient tolerated treatment well Patient left: in bed;with call bell/phone within reach;with family/visitor present Nurse Communication: Mobility status PT Visit Diagnosis: Unsteadiness on feet (R26.81);Pain Pain - Right/Left: Left Pain - part of body: Hip     Time: 1440-1534 PT Time Calculation (min) (ACUTE ONLY): 54  min  Charges:  $Gait Training: 23-37 mins   $Self Care/Home Management: 23-37                    G Codes:          Rada HayHill, Ambert Virrueta Elizabeth 07/18/2017, 4:15 PM

## 2017-07-18 NOTE — Discharge Instructions (Signed)
° °Dr. Frank Aluisio °Total Joint Specialist °[facility] °[address] °[place], [place] [address] °[phone] ° °ANTERIOR APPROACH TOTAL HIP REPLACEMENT POSTOPERATIVE DIRECTIONS ° ° °Hip Rehabilitation, Guidelines Following Surgery  °The results of a hip operation are greatly improved after range of motion and muscle strengthening exercises. Follow all safety measures which are given to protect your hip. If any of these exercises cause increased pain or swelling in your joint, decrease the amount until you are comfortable again. Then slowly increase the exercises. Call your caregiver if you have problems or questions.  ° °HOME CARE INSTRUCTIONS  °Remove items at home which could result in a fall. This includes throw rugs or furniture in walking pathways.  °· ICE to the affected hip every three hours for 30 minutes at a time and then as needed for pain and swelling.  Continue to use ice on the hip for pain and swelling from surgery. You may notice swelling that will progress down to the foot and ankle.  This is normal after surgery.  Elevate the leg when you are not up walking on it.   °· Continue to use the breathing machine which will help keep your temperature down.  It is common for your temperature to cycle up and down following surgery, especially at night when you are not up moving around and exerting yourself.  The breathing machine keeps your lungs expanded and your temperature down. ° ° °DIET °You may resume your previous home diet once your are discharged from the hospital. ° °DRESSING / WOUND CARE / SHOWERING °You may shower 3 days after surgery, but keep the wounds dry during showering.  You may use an occlusive plastic wrap (Press'n Seal for example), NO SOAKING/SUBMERGING IN THE BATHTUB.  If the bandage gets wet, change with a clean dry gauze.  If the incision gets wet, pat the wound dry with a clean towel. °You may start showering once you are discharged home but do not submerge  the incision under water. Just pat the incision dry and apply a dry gauze dressing on daily. °Change the surgical dressing daily and reapply a dry dressing each time. ° °ACTIVITY °Walk with your walker as instructed. °Use walker as long as suggested by your caregivers. °Avoid periods of inactivity such as sitting longer than an hour when not asleep. This helps prevent blood clots.  °You may resume a sexual relationship in one month or when given the OK by your doctor.  °You may return to work once you are cleared by your doctor.  °Do not drive a car for 6 weeks or until released by you surgeon.  °Do not drive while taking narcotics. ° °WEIGHT BEARING °Weight bearing as tolerated with assist device (walker, cane, etc) as directed, use it as long as suggested by your surgeon or therapist, typically at least 4-6 weeks. ° °POSTOPERATIVE CONSTIPATION PROTOCOL °Constipation - defined medically as fewer than three stools per week and severe constipation as less than one stool per week. ° °One of the most common issues patients have following surgery is constipation.  Even if you have a regular bowel pattern at home, your normal regimen is likely to be disrupted due to multiple reasons following surgery.  Combination of anesthesia, postoperative narcotics, change in appetite and fluid intake all can affect your bowels.  In order to avoid complications following surgery, here are some recommendations in order to help you during your recovery period. ° °Colace (docusate) - Pick up an over-the-counter   form of Colace or another stool softener and take twice a day as long as you are requiring postoperative pain medications.  Take with a full glass of water daily.  If you experience loose stools or diarrhea, hold the colace until you stool forms back up.  If your symptoms do not get better within 1 week or if they get worse, check with your doctor. ° °Dulcolax (bisacodyl) - Pick up over-the-counter and take as directed by the  product packaging as needed to assist with the movement of your bowels.  Take with a full glass of water.  Use this product as needed if not relieved by Colace only.  ° °MiraLax (polyethylene glycol) - Pick up over-the-counter to have on hand.  MiraLax is a solution that will increase the amount of water in your bowels to assist with bowel movements.  Take as directed and can mix with a glass of water, juice, soda, coffee, or tea.  Take if you go more than two days without a movement. °Do not use MiraLax more than once per day. Call your doctor if you are still constipated or irregular after using this medication for 7 days in a row. ° °If you continue to have problems with postoperative constipation, please contact the office for further assistance and recommendations.  If you experience "the worst abdominal pain ever" or develop nausea or vomiting, please contact the office immediatly for further recommendations for treatment. ° °ITCHING ° If you experience itching with your medications, try taking only a single pain pill, or even half a pain pill at a time.  You can also use Benadryl over the counter for itching or also to help with sleep.  ° °TED HOSE STOCKINGS °Wear the elastic stockings on both legs for three weeks following surgery during the day but you may remove then at night for sleeping. ° °MEDICATIONS °See your medication summary on the “After Visit Summary” that the nursing staff will review with you prior to discharge.  You may have some home medications which will be placed on hold until you complete the course of blood thinner medication.  It is important for you to complete the blood thinner medication as prescribed by your surgeon.  Continue your approved medications as instructed at time of discharge. ° °PRECAUTIONS °If you experience chest pain or shortness of breath - call 911 immediately for transfer to the hospital emergency department.  °If you develop a fever greater that 101 F, purulent  drainage from wound, increased redness or drainage from wound, foul odor from the wound/dressing, or calf pain - CONTACT YOUR SURGEON.   °                                                °FOLLOW-UP APPOINTMENTS °Make sure you keep all of your appointments after your operation with your surgeon and caregivers. You should call the office at the above phone number and make an appointment for approximately two weeks after the date of your surgery or on the date instructed by your surgeon outlined in the "After Visit Summary". ° °RANGE OF MOTION AND STRENGTHENING EXERCISES  °These exercises are designed to help you keep full movement of your hip joint. Follow your caregiver's or physical therapist's instructions. Perform all exercises about fifteen times, three times per day or as directed. Exercise both hips, even if you   have had only one joint replacement. These exercises can be done on a training (exercise) mat, on the floor, on a table or on a bed. Use whatever works the best and is most comfortable for you. Use music or television while you are exercising so that the exercises are a pleasant break in your day. This will make your life better with the exercises acting as a break in routine you can look forward to.  °Lying on your back, slowly slide your foot toward your buttocks, raising your knee up off the floor. Then slowly slide your foot back down until your leg is straight again.  °Lying on your back spread your legs as far apart as you can without causing discomfort.  °Lying on your side, raise your upper leg and foot straight up from the floor as far as is comfortable. Slowly lower the leg and repeat.  °Lying on your back, tighten up the muscle in the front of your thigh (quadriceps muscles). You can do this by keeping your leg straight and trying to raise your heel off the floor. This helps strengthen the largest muscle supporting your knee.  °Lying on your back, tighten up the muscles of your buttocks both  with the legs straight and with the knee bent at a comfortable angle while keeping your heel on the floor.  ° °IF YOU ARE TRANSFERRED TO A SKILLED REHAB FACILITY °If the patient is transferred to a skilled rehab facility following release from the hospital, a list of the current medications will be sent to the facility for the patient to continue.  When discharged from the skilled rehab facility, please have the facility set up the patient's Home Health Physical Therapy prior to being released. Also, the skilled facility will be responsible for providing the patient with their medications at time of release from the facility to include their pain medication, the muscle relaxants, and their blood thinner medication. If the patient is still at the rehab facility at time of the two week follow up appointment, the skilled rehab facility will also need to assist the patient in arranging follow up appointment in our office and any transportation needs. ° °MAKE SURE YOU:  °Understand these instructions.  °Get help right away if you are not doing well or get worse.  ° ° °Pick up stool softner and laxative for home use following surgery while on pain medications. °Do not submerge incision under water. °Please use good hand washing techniques while changing dressing each day. °May shower starting three days after surgery. °Please use a clean towel to pat the incision dry following showers. °Continue to use ice for pain and swelling after surgery. °Do not use any lotions or creams on the incision until instructed by your surgeon. ° °Take Xarelto for two and a half more weeks following discharge from the hospital, then discontinue Xarelto. °Once the patient has completed the blood thinner regimen, then take a Baby 81 mg Aspirin daily for three more weeks. ° ° °Information on my medicine - XARELTO® (Rivaroxaban) ° °This medication education was reviewed with me or my healthcare representative as part of my discharge preparation.   The pharmacist that spoke with me during my hospital stay was:   ° °Why was Xarelto® prescribed for you? °Xarelto® was prescribed for you to reduce the risk of blood clots forming after orthopedic surgery. The medical term for these abnormal blood clots is venous thromboembolism (VTE). ° °What do you need to know about xarelto® ? °Take   your Xarelto® ONCE DAILY at the same time every day. °You may take it either with or without food. ° °If you have difficulty swallowing the tablet whole, you may crush it and mix in applesauce just prior to taking your dose. ° °Take Xarelto® exactly as prescribed by your doctor and DO NOT stop taking Xarelto® without talking to the doctor who prescribed the medication.  Stopping without other VTE prevention medication to take the place of Xarelto® may increase your risk of developing a clot. ° °After discharge, you should have regular check-up appointments with your healthcare provider that is prescribing your Xarelto®.   ° °What do you do if you miss a dose? °If you miss a dose, take it as soon as you remember on the same day then continue your regularly scheduled once daily regimen the next day. Do not take two doses of Xarelto® on the same day.  ° °Important Safety Information °A possible side effect of Xarelto® is bleeding. You should call your healthcare provider right away if you experience any of the following: °? Bleeding from an injury or your nose that does not stop. °? Unusual colored urine (red or dark brown) or unusual colored stools (red or black). °? Unusual bruising for unknown reasons. °? A serious fall or if you hit your head (even if there is no bleeding). ° °Some medicines may interact with Xarelto® and might increase your risk of bleeding while on Xarelto®. To help avoid this, consult your healthcare provider or pharmacist prior to using any new prescription or non-prescription medications, including herbals, vitamins, non-steroidal anti-inflammatory drugs  (NSAIDs) and supplements. ° °This website has more information on Xarelto®: www.xarelto.com. ° ° °

## 2017-07-18 NOTE — Progress Notes (Signed)
   Subjective: 1 Day Post-Op Procedure(s) (LRB): LEFT TOTAL HIP ARTHROPLASTY ANTERIOR APPROACH (Left) Patient reports pain as mild.   Patient seen in rounds by Dr. Lequita HaltAluisio. Patient is well, but has had some minor complaints of pain in the hip, requiring pain medications We will start therapy today.  If they do well with therapy and meets all goals, then will allow home later this afternoon following therapy. Plan is to go Home after hospital stay.  Objective: Vital signs in last 24 hours: Temp:  [97.3 F (36.3 C)-99 F (37.2 C)] 98.2 F (36.8 C) (03/21 0557) Pulse Rate:  [63-89] 88 (03/21 0557) Resp:  [10-22] 16 (03/21 0557) BP: (139-204)/(69-93) 174/78 (03/21 0557) SpO2:  [100 %] 100 % (03/21 0557) Weight:  [49.4 kg (109 lb)] 49.4 kg (109 lb) (03/20 0837)  Intake/Output from previous day:  Intake/Output Summary (Last 24 hours) at 07/18/2017 0830 Last data filed at 07/18/2017 0600 Gross per 24 hour  Intake 1773.75 ml  Output 1970 ml  Net -196.25 ml    Intake/Output this shift: No intake/output data recorded.  Labs: No results for input(s): HGB in the last 72 hours. No results for input(s): WBC, RBC, HCT, PLT in the last 72 hours. Recent Labs    07/18/17 0603  NA 138  K 3.1*  CL 103  CO2 24  BUN 18  CREATININE 0.85  GLUCOSE 117*  CALCIUM 8.3*   No results for input(s): LABPT, INR in the last 72 hours.  EXAM General - Patient is Alert, Appropriate and Oriented Extremity - Neurovascular intact Sensation intact distally Intact pulses distally Dorsiflexion/Plantar flexion intact Dressing - dressing C/D/I Motor Function - intact, moving foot and toes well on exam.  Hemovac pulled without difficulty.  Past Medical History:  Diagnosis Date  . Acid reflux   . Anemia   . Anxiety   . Avascular necrosis of bone of hip, left (HCC)   . Complication of anesthesia    hard time being put to sleep, has awakened a few times during endoscopy  . Depression   . Elevated  liver enzymes   . Headache   . High cholesterol   . History of blood transfusion   . History of gallstones   . History of hypokalemia   . History of measles    childhood  . History of mumps as a child   . History of stomach ulcers   . Hypertension   . Hyponatremia    History of  . Macular degeneration   . OA (osteoarthritis)   . Osteoporosis   . Seizures (HCC)   . Stomach ulcer     Assessment/Plan: 1 Day Post-Op Procedure(s) (LRB): LEFT TOTAL HIP ARTHROPLASTY ANTERIOR APPROACH (Left) Principal Problem:   OA (osteoarthritis) of hip  Estimated body mass index is 19.31 kg/m as calculated from the following:   Height as of this encounter: 5\' 3"  (1.6 m).   Weight as of this encounter: 49.4 kg (109 lb). Advance diet Up with therapy  DVT Prophylaxis - Xarelto Weight Bearing As Tolerated left Leg Hemovac Pulled Begin Therapy  If meets goals and able to go home: Up with therapy Diet - Cardiac diet Follow up - in 2 weeks Activity - WBAT Disposition - Home Condition Upon Discharge - pending therapy D/C Meds - See DC Summary DVT Prophylaxis - Xarelto  Avel Peacerew Perkins, PA-C Orthopaedic Surgery 07/18/2017, 8:30 AM

## 2017-07-18 NOTE — Evaluation (Signed)
Physical Therapy Evaluation Patient Details Name: [name] MRN: [id number] DOB: 1950/06/13 Today's Date: 07/18/2017   History of Present Illness  DA THA on LEFT.  Clinical Impression   The patient's gait is very slow and small steps, stops to rest UE's and stand erect. Falling asleep during exercises. Patient may benefit from more sessions with PT/OT prior to Dc. Son's will be primary caregivers.  Patient plans to sleep on couch due to bed being very high, per patient.  Pt admitted with above diagnosis. Pt currently with functional limitations due to the deficits listed below (see PT Problem List).  Pt will benefit from skilled PT to increase their independence and safety with mobility to allow discharge to the venue listed below.       Follow Up Recommendations Home health PT    Equipment Recommendations  None recommended by PT    Recommendations for Other Services   OT    Precautions / Restrictions Precautions Precautions: Fall      Mobility  Bed Mobility               General bed mobility comments: up by nursing  Transfers Overall transfer level: Needs assistance Equipment used: Rolling walker (2 wheeled)             General transfer comment: up with nursing in BR  Ambulation/Gait Ambulation/Gait assistance: Min assist Ambulation Distance (Feet): 40 Feet Assistive device: Rolling walker (2 wheeled) Gait Pattern/deviations: Step-to pattern;Step-through pattern;Shuffle;Decreased step length - right;Decreased step length - left     General Gait Details: very small shuffling  steps, requires extra time to  progress. Stopped x 3 to stand erect and to rest UE's.   Stairs            Wheelchair Mobility    Modified Rankin (Stroke Patients Only)       Balance                                             Pertinent Vitals/Pain Pain Assessment: 0-10 Pain Score: 5  Pain Location: left hip and thigh Pain Descriptors /  Indicators: Discomfort;Grimacing;Guarding Pain Intervention(s): Limited activity within patient's tolerance;Monitored during session;Premedicated before session;Ice applied    Home Living Family/patient expects to be discharged to:: Private residence Living Arrangements: Children Available Help at Discharge: Family Type of Home: House Home Access: Stairs to enter   Secretary/administratorntrance Stairs-Number of Steps: 1 Home Layout: One level Home Equipment: Environmental consultantWalker - 2 wheels;Cane - single point      Prior Function Level of Independence: Independent with assistive device(s)               Hand Dominance        Extremity/Trunk Assessment   Upper Extremity Assessment Upper Extremity Assessment: Defer to OT evaluation    Lower Extremity Assessment LLE Deficits / Details: able to advance very small steps     Cervical / Trunk Assessment Cervical / Trunk Assessment: Kyphotic  Communication   Communication: No difficulties  Cognition Arousal/Alertness: Lethargic;Suspect due to medications [facility] tasks assessed/performed Overall Cognitive Status: Within Functional Limits for tasks assessed                                 General Comments: patient  required constant stimulation to arouse for exercises  General Comments      Exercises Total Joint Exercises Ankle Circles/Pumps: AROM;10 reps;Both Quad Sets: AROM;10 reps;Both Short Arc Quad: AROM;Left;10 reps Heel Slides: AAROM;Left Hip ABduction/ADduction: AAROM;Left;10 reps   Assessment/Plan    PT Assessment Patient needs continued PT services  PT Problem List Decreased strength;Decreased range of motion;Decreased knowledge of use of DME;Decreased activity tolerance;Decreased safety awareness;Decreased knowledge of precautions;Decreased mobility;Pain       PT Treatment Interventions DME instruction;Gait training;Therapeutic exercise;Functional mobility training;Stair training;Therapeutic  activities;Patient/family education    PT Goals (Current goals can be found in the Care Plan section)  Acute Rehab PT Goals Patient Stated Goal: to walk with leaning over PT Goal Formulation: With patient/family Time For Goal Achievement: 07/25/17 Potential to Achieve Goals: Good    Frequency 7X/week   Barriers to discharge        Co-evaluation               AM-PAC PT "6 Clicks" Daily Activity  Outcome Measure Difficulty turning over in bed (including adjusting bedclothes, sheets and blankets)?: Unable Difficulty moving from lying on back to sitting on the side of the bed? : Unable Difficulty sitting down on and standing up from a chair with arms (e.g., wheelchair, bedside commode, etc,.)?: Unable Help needed moving to and from a bed to chair (including a wheelchair)?: Total Help needed walking in hospital room?: Total Help needed climbing 3-5 steps with a railing? : Total 6 Click Score: 6    End of Session   Activity Tolerance: Patient limited by fatigue;Patient limited by pain Patient left: in chair;with call bell/phone within reach;with family/visitor present Nurse Communication: Mobility status PT Visit Diagnosis: Unsteadiness on feet (R26.81);Pain Pain - Right/Left: Left Pain - part of body: Hip    Time: 8657-8469 PT Time Calculation (min) (ACUTE ONLY): 45 min   Charges:   PT Evaluation $PT Eval Low Complexity: 1 Low PT Treatments $Gait Training: 8-22 mins $Therapeutic Exercise: 8-22 mins   PT G Codes:        {Cherylin Waguespack PT [phone]   Rada Hay 07/18/2017, 2:00 PM

## 2017-07-18 NOTE — Discharge Summary (Signed)
Physician Discharge Summary   Patient ID: [name] MRN: [id number] DOB/AGE: 67-30-1952 67 y.o.  Admit date: 07/17/2017 Discharge date: 07-19-2017  Primary Diagnosis:  Osteonecrosis of the Left  hip.    Admission Diagnoses:  Past Medical History:  Diagnosis Date  . Acid reflux   . Anemia   . Anxiety   . Avascular necrosis of bone of hip, left ([place])   . Complication of anesthesia    hard time being put to sleep, has awakened a few times during endoscopy  . Depression   . Elevated liver enzymes   . Headache   . High cholesterol   . History of blood transfusion   . History of gallstones   . History of hypokalemia   . History of measles    childhood  . History of mumps as a child   . History of stomach ulcers   . Hypertension   . Hyponatremia    History of  . Macular degeneration   . OA (osteoarthritis)   . Osteoporosis   . Seizures ([place])   . Stomach ulcer    Discharge Diagnoses:   Principal Problem:   OA (osteoarthritis) of hip  Estimated body mass index is 19.31 kg/m as calculated from the following:   Height as of this encounter: _0  (1.6 m).   Weight as of this encounter: 49.4 kg (109 lb).  Procedure:  Procedure(s) (LRB): LEFT TOTAL HIP ARTHROPLASTY ANTERIOR APPROACH (Left)   Consults: None  HPI: [name] is a 67 y.o. female who has advancedosteonecrosis of their Left hip with progressively worsening pain and  dysfunction.The patient has failed nonoperative management and presents for  total hip arthroplasty.    Laboratory Data: Admission on 07/17/2017  Component Date Value Ref Range Status  . Sodium 07/18/2017 138  135 - 145 mmol/L Final  . Potassium 07/18/2017 3.1* 3.5 - 5.1 mmol/L Final  . Chloride 07/18/2017 103  101 - 111 mmol/L Final  . CO2 07/18/2017 24  22 - 32 mmol/L Final  . Glucose, Bld 07/18/2017 117* 65 - 99 mg/dL Final  . BUN 07/18/2017 18  6 - 20 mg/dL Final  . Creatinine, Ser 07/18/2017 0.85  0.44 - 1.00 mg/dL Final    . Calcium 07/18/2017 8.3* 8.9 - 10.3 mg/dL Final  . GFR calc non Af Amer 07/18/2017 >60  >60 mL/min Final  . GFR calc Af Amer 07/18/2017 >60  >60 mL/min Final   Comment: (NOTE) The eGFR has been calculated using the CKD EPI equation. This calculation has not been validated in all clinical situations. eGFR's persistently <60 mL/min signify possible Chronic Kidney Disease.   Georgiann Hahn gap 07/18/2017 11  5 - 15 Final   Performed at [facility], [place] [address] [place], [place] [address]  Hospital Outpatient Visit on 07/12/2017  Component Date Value Ref Range Status  . aPTT 07/12/2017 32  24 - 36 seconds Final   Performed at [facility], [place] [address] [place], [place] [address]  . Sodium 07/12/2017 132* 135 - 145 mmol/L Final  . Potassium 07/12/2017 3.4* 3.5 - 5.1 mmol/L Final  . Chloride 07/12/2017 98* 101 - 111 mmol/L Final  . CO2 07/12/2017 24  22 - 32 mmol/L Final  . Glucose, Bld 07/12/2017 110* 65 - 99 mg/dL Final  . BUN 07/12/2017 16  6 - 20 mg/dL Final  . Creatinine, Ser 07/12/2017 0.72  0.44 - 1.00 mg/dL Final  . Calcium 07/12/2017 9.1  8.9 -  10.3 mg/dL Final  . Total Protein 07/12/2017 7.0  6.5 - 8.1 g/dL Final  . Albumin 07/12/2017 4.0  3.5 - 5.0 g/dL Final  . AST 07/12/2017 25  15 - 41 U/L Final  . ALT 07/12/2017 22  14 - 54 U/L Final  . Alkaline Phosphatase 07/12/2017 115  38 - 126 U/L Final  . Total Bilirubin 07/12/2017 0.4  0.3 - 1.2 mg/dL Final  . GFR calc non Af Amer 07/12/2017 >60  >60 mL/min Final  . GFR calc Af Amer 07/12/2017 >60  >60 mL/min Final   Comment: (NOTE) The eGFR has been calculated using the CKD EPI equation. This calculation has not been validated in all clinical situations. eGFR's persistently <60 mL/min signify possible Chronic Kidney Disease.   Georgiann Hahn gap 07/12/2017 10  5 - 15 Final   Performed at [facility], [place] [address] [place], [place] [address]  . Prothrombin Time 07/12/2017  13.8  11.4 - 15.2 seconds Final  . INR 07/12/2017 1.07   Final   Performed at [facility] [address], [place], [place] [address]  . ABO/RH(D) 07/12/2017 O NEG   Final  . Antibody Screen 07/12/2017 NEG   Final  . Sample Expiration 07/12/2017 07/20/2017   Final  . Extend sample reason 07/12/2017    Final                   Value:NO TRANSFUSIONS OR PREGNANCY IN THE PAST 3 MONTHS Performed at [facility], [place] [address], [place], [place] [address]   . MRSA, PCR 07/12/2017 NEGATIVE  NEGATIVE Final  . Staphylococcus aureus 07/12/2017 POSITIVE* NEGATIVE Final   Comment: (NOTE) The Xpert SA Assay (FDA approved for NASAL specimens in patients 49 years of age and older), is one component of a comprehensive surveillance program. It is not intended to diagnose infection nor to guide or monitor treatment. Performed at [facility], [place] [address], [place], [place] [address]   . ABO/RH(D) 07/12/2017    Final                   Value:O NEG Performed at [facility], [place] [address] [place], [place] [address]      X-Rays:Dg Pelvis Portable  Result Date: 07/17/2017 CLINICAL DATA:  Status post left hip replacement EXAM: PORTABLE PELVIS 1-2 VIEWS COMPARISON:  Films from earlier in the same day. FINDINGS: Left hip prosthesis is seen. Surgical drain is noted in place. No acute bony or soft tissue abnormality is noted. IMPRESSION: Status post left hip replacement. Electronically Signed   By: Inez Catalina M.D.   On: 07/17/2017 12:35   Dg C-arm 1-60 Min-no Report  Result Date: 07/17/2017 Fluoroscopy was utilized by the requesting physician.  No radiographic interpretation.    EKG: Orders placed or performed during the hospital encounter of 11/23/16  . EKG 12-Lead  . EKG 12-Lead  . EKG     Hospital Course: [name] is a 67 y.o. who was admitted to [facility]. They were brought to the operating room on  07/17/2017 and underwent Procedure(s): LEFT TOTAL HIP ARTHROPLASTY ANTERIOR APPROACH.  Patient tolerated the procedure well and was later transferred to the recovery room and then to the orthopaedic floor for postoperative care.  They were given PO and IV analgesics for pain control following their surgery.  They were given 24 hours of postoperative antibiotics of  Anti-infectives (From admission, onward)  Start     Dose/Rate Route Frequency Ordered Stop   07/17/17 1600  ceFAZolin (ANCEF) IVPB 1 g/50 mL premix     1 g 100 mL/hr over 30 Minutes Intravenous Every 6 hours 07/17/17 1334 07/17/17 2234   07/17/17 0806  ceFAZolin (ANCEF) IVPB 2g/100 mL premix  Status:  Discontinued     2 g 200 mL/hr over 30 Minutes Intravenous On call to O.R. 07/17/17 1610 07/17/17 1333     and started on DVT prophylaxis in the form of Xarelto.   PT and OT were ordered for total joint protocol.  Discharge planning consulted to help with postop disposition and equipment needs.  Patient had a decent night on the evening of surgery.  They started to get up OOB with therapy on day one. Hemovac drain was pulled without difficulty.  Continued to work with therapy into day two.  Dressing was changed on day two and the incision was healing well.  Patient was seen in rounds on POD 2 and was ready to go home.  Diet - Cardiac diet Follow up - in 2 weeks Activity - WBAT Disposition - Home Condition Upon Discharge - stable D/C Meds - See DC Summary DVT Prophylaxis - Xarelto     Discharge Instructions    Call MD / Call 911   Complete by:  As directed    If you experience chest pain or shortness of breath, CALL 911 and be transported to the hospital emergency room.  If you develope a fever above 101 F, pus (white drainage) or increased drainage or redness at the wound, or calf pain, call your surgeon's office.   Change dressing   Complete by:  As directed    You may change your dressing dressing daily with sterile 4 x 4 inch  gauze dressing and paper tape.  Do not submerge the incision under water.   Constipation Prevention   Complete by:  As directed    Drink plenty of fluids.  Prune juice may be helpful.  You may use a stool softener, such as Colace (over the counter) 100 mg twice a day.  Use MiraLax (over the counter) for constipation as needed.   Diet - low sodium heart healthy   Complete by:  As directed    Discharge instructions   Complete by:  As directed    Take Xarelto for two and a half more weeks, then discontinue Xarelto. Once the patient has completed the blood thinner regimen, then take a Baby 81 mg Aspirin daily for three more weeks.   Pick up stool softner and laxative for home use following surgery while on pain medications. Do not submerge incision under water. Please use good hand washing techniques while changing dressing each day. May shower starting three days after surgery. Please use a clean towel to pat the incision dry following showers. Continue to use ice for pain and swelling after surgery. Do not use any lotions or creams on the incision until instructed by your surgeon.  Wear both TED hose on both legs during the day every day for three weeks, but may remove the TED hose at night at home.  Postoperative Constipation Protocol  Constipation - defined medically as fewer than three stools per week and severe constipation as less than one stool per week.  One of the most common issues patients have following surgery is constipation.  Even if you have a regular bowel pattern at home, your normal regimen is likely to be disrupted due  to multiple reasons following surgery.  Combination of anesthesia, postoperative narcotics, change in appetite and fluid intake all can affect your bowels.  In order to avoid complications following surgery, here are some recommendations in order to help you during your recovery period.  Colace (docusate) - Pick up an over-the-counter form of Colace or  another stool softener and take twice a day as long as you are requiring postoperative pain medications.  Take with a full glass of water daily.  If you experience loose stools or diarrhea, hold the colace until you stool forms back up.  If your symptoms do not get better within 1 week or if they get worse, check with your doctor.  Dulcolax (bisacodyl) - Pick up over-the-counter and take as directed by the product packaging as needed to assist with the movement of your bowels.  Take with a full glass of water.  Use this product as needed if not relieved by Colace only.   MiraLax (polyethylene glycol) - Pick up over-the-counter to have on hand.  MiraLax is a solution that will increase the amount of water in your bowels to assist with bowel movements.  Take as directed and can mix with a glass of water, juice, soda, coffee, or tea.  Take if you go more than two days without a movement. Do not use MiraLax more than once per day. Call your doctor if you are still constipated or irregular after using this medication for 7 days in a row.  If you continue to have problems with postoperative constipation, please contact the office for further assistance and recommendations.  If you experience "the worst abdominal pain ever" or develop nausea or vomiting, please contact the office immediatly for further recommendations for treatment.   Do not sit on low chairs, stoools or toilet seats, as it may be difficult to get up from low surfaces   Complete by:  As directed    Driving restrictions   Complete by:  As directed    No driving until released by the physician.   Increase activity slowly as tolerated   Complete by:  As directed    Lifting restrictions   Complete by:  As directed    No lifting until released by the physician.   Patient may shower   Complete by:  As directed    You may shower without a dressing once there is no drainage.  Do not wash over the wound.  If drainage remains, do not shower until  drainage stops.   TED hose   Complete by:  As directed    Use stockings (TED hose) for 3 weeks on both leg(s).  You may remove them at night for sleeping.   Weight bearing as tolerated   Complete by:  As directed    Laterality:  left   Extremity:  Lower     Allergies as of 07/18/2017      Reactions   Celecoxib Other (See Comments)   unknown   Codeine Swelling   throat swelling   Other Hives   Seizure meds, dilantin   Phenytoin Sodium Extended Hives, Dermatitis   Toradol [ketorolac Tromethamine] Other (See Comments)   seizures   Tramadol Hcl Other (See Comments)   Seizures and hallucinations   Atorvastatin Rash   Iodides Rash   Shellfish Allergy Itching, Rash   Simvastatin Rash      Medication List    STOP taking these medications   CALCIUM-MAGNESUIUM-ZINC PO   Cholecalciferol 4000 units Tabs  Fish Oil 1200 MG Caps   HAIR/SKIN/NAILS Tabs   HYDROcodone-acetaminophen 5-325 MG tablet Commonly known as:  NORCO/VICODIN   Magnesium 500 MG Tabs   meloxicam 15 MG tablet Commonly known as:  MOBIC   PRESERVISION AREDS 2 PO   vitamin C 500 MG tablet Commonly known as:  ASCORBIC ACID     TAKE these medications   amLODipine 10 MG tablet Commonly known as:  NORVASC Take 10 mg by mouth daily.   clonazePAM 0.25 MG disintegrating tablet Commonly known as:  KLONOPIN Take 0.0625 mg by mouth 2 (two) times daily.   gabapentin 100 MG capsule Commonly known as:  NEURONTIN Take 100-300 mg by mouth 2 (two) times daily as needed (for pain.). Take 1 capsule (100 mg) by mouth at supper as needed for pain and Take 3 capsules (300 mg) by mouth scheduled at bedtime   meclizine 25 MG tablet Commonly known as:  ANTIVERT Take 25 mg by mouth 3 (three) times daily as needed for dizziness.   Melatonin 10 MG Tabs Take 15 mg by mouth at bedtime as needed (for sleep.).   methocarbamol 500 MG tablet Commonly known as:  ROBAXIN Take 1 tablet (500 mg total) by mouth every 6 (six)  hours as needed for muscle spasms.   oxyCODONE 5 MG immediate release tablet Commonly known as:  Oxy IR/ROXICODONE Take 1-2 tablets (5-10 mg total) by mouth every 4 (four) hours as needed for moderate pain or severe pain.   potassium chloride 20 MEQ packet Commonly known as:  KLOR-CON Take 20 mEq by mouth 2 (two) times daily.   pravastatin 20 MG tablet Commonly known as:  PRAVACHOL Take 60 mg by mouth daily after supper.   prochlorperazine 10 MG tablet Commonly known as:  COMPAZINE Take 10 mg by mouth every 6 (six) hours as needed for nausea or vomiting (takes at least 1 time daily in the morning).   rivaroxaban 10 MG Tabs tablet Commonly known as:  XARELTO Take 1 tablet (10 mg total) by mouth daily with breakfast. Take Xarelto for two and a half more weeks following discharge from the hospital, then discontinue Xarelto. Once the patient has completed the blood thinner regimen, then take a Baby 81 mg Aspirin daily for three more weeks.   SLOW RELEASE IRON PO Take 85 mg by mouth daily after breakfast.   sucralfate 1 g tablet Commonly known as:  CARAFATE Take 1 g by mouth 2 (two) times daily.   topiramate 100 MG tablet Commonly known as:  TOPAMAX Take 100 mg by mouth 4 (four) times daily.            Discharge Care Instructions  (From admission, onward)        Start     Ordered   07/18/17 0000  Weight bearing as tolerated    Question Answer Comment  Laterality left   Extremity Lower      07/18/17 0841   07/18/17 0000  Change dressing    Comments:  You may change your dressing dressing daily with sterile 4 x 4 inch gauze dressing and paper tape.  Do not submerge the incision under water.   07/18/17 0841     Follow-up Information    Gaynelle Arabian, MD. Schedule an appointment as soon as possible for a visit on 07/30/2017.   Specialty:  Orthopedic Surgery Contact information: [address] [place] [address] [phone]            Signed: Arlee Muslim,  PA-C Orthopaedic Surgery 07/18/2017, 8:44 AM

## 2017-07-18 NOTE — Progress Notes (Signed)
Discharge planning, spoke with patient and son at bedside. [facility]. Contacted [facility] for referral. Has a RW but needs a 3n1, contacted [facility] to deliver to room. [phone]

## 2017-07-19 LAB — CBC
HCT: 29.6 % — ABNORMAL LOW (ref 36.0–46.0)
HEMOGLOBIN: 9.5 g/dL — AB (ref 12.0–15.0)
MCH: 28.8 pg (ref 26.0–34.0)
MCHC: 32.1 g/dL (ref 30.0–36.0)
MCV: 89.7 fL (ref 78.0–100.0)
PLATELETS: 142 10*3/uL — AB (ref 150–400)
RBC: 3.3 MIL/uL — AB (ref 3.87–5.11)
WBC: 6.6 10*3/uL (ref 4.0–10.5)

## 2017-07-19 LAB — BASIC METABOLIC PANEL
Anion gap: 11 (ref 5–15)
BUN: 20 mg/dL (ref 6–20)
CHLORIDE: 102 mmol/L (ref 101–111)
CO2: 22 mmol/L (ref 22–32)
Calcium: 9 mg/dL (ref 8.9–10.3)
Creatinine, Ser: 0.73 mg/dL (ref 0.44–1.00)
GFR calc Af Amer: >60 mL/min (ref >60)
Glucose, Bld: 100 mg/dL — ABNORMAL HIGH (ref 65–99)
POTASSIUM: 3.6 mmol/L (ref 3.5–5.1)
SODIUM: 135 mmol/L (ref 135–145)

## 2017-07-19 NOTE — Progress Notes (Signed)
Physical Therapy Treatment Patient Details Name: [name] MRN: [id number] DOB: February 20, 1951 Today's Date: 07/19/2017    History of Present Illness Pt is a 67 year old female s/p Left DA THA    PT Comments    Pt performed LE exercises in supine and sitting and reviewed HEP handout.  Pt educated to wait for next therapist to start with standing exercises for safety.  Pt also ambulated short distance to one step and performed for son to observe since he will be assisting her home today.   Follow Up Recommendations  Home health PT     Equipment Recommendations  None recommended by PT    Recommendations for Other Services       Precautions / Restrictions Precautions Precautions: Fall    Mobility  Bed Mobility Overal bed mobility: Needs Assistance Bed Mobility: Supine to Sit;Sit to Supine     Supine to sit: Min assist Sit to supine: Min assist   General bed mobility comments: educated on using leg lifter however needed more assist due to pain, son present and observed  Transfers Overall transfer level: Needs assistance Equipment used: Rolling walker (2 wheeled) Transfers: Sit to/from Stand Sit to Stand: Min guard         General transfer comment: increased time, verbal cues for technique  Ambulation/Gait Ambulation/Gait assistance: Min guard Ambulation Distance (Feet): 25 Feet Assistive device: Rolling walker (2 wheeled) Gait Pattern/deviations: Step-to pattern;Antalgic     General Gait Details: verbal cues for sequence, step length, posture, distance limited due to pain   Stairs Stairs: Yes   Stair Management: Step to pattern;Backwards;With walker Number of Stairs: 1 General stair comments: verbal cues for safety and sequence, son observed  Wheelchair Mobility    Modified Rankin (Stroke Patients Only)       Balance                                            Cognition Arousal/Alertness: Awake/alert Behavior During  Therapy: WFL for tasks assessed/performed;Anxious Overall Cognitive Status: Within Functional Limits for tasks assessed                                        Exercises Total Joint Exercises Ankle Circles/Pumps: AROM;10 reps;Both Quad Sets: AROM;10 reps;Both Short Arc Quad: AROM;Left;10 reps;Supine Heel Slides: AAROM;Left;10 reps;Supine Hip ABduction/ADduction: AAROM;Left;10 reps;Supine Long Arc Quad: AROM;10 reps;Seated;Left    General Comments        Pertinent Vitals/Pain Pain Assessment: 0-10 Pain Score: 4  Pain Location: left hip and thigh Pain Descriptors / Indicators: Grimacing;Guarding;Sore Pain Intervention(s): Limited activity within patient's tolerance;Repositioned;Monitored during session;Heat applied(heat to bottom and anterior mid thigh (aware no heat over incision))    Home Living                      Prior Function            PT Goals (current goals can now be found in the care plan section) Progress towards PT goals: Progressing toward goals    Frequency    7X/week      PT Plan Current plan remains appropriate    Co-evaluation              AM-PAC PT "6 Clicks" Daily Activity  Outcome Measure  Difficulty turning over in bed (including adjusting bedclothes, sheets and blankets)?: A Little Difficulty moving from lying on back to sitting on the side of the bed? : Unable Difficulty sitting down on and standing up from a chair with arms (Mann.g., wheelchair, bedside commode, etc,.)?: Unable Help needed moving to and from a bed to chair (including a wheelchair)?: A Little Help needed walking in hospital room?: A Little Help needed climbing 3-5 steps with a railing? : A Little 6 Click Score: 14    End of Session Equipment Utilized During Treatment: Gait belt Activity Tolerance: Patient limited by pain Patient left: with call bell/phone within reach;in bed;with family/visitor present Nurse Communication: Mobility  status PT Visit Diagnosis: Pain;Difficulty in walking, not elsewhere classified (R26.2) Pain - Right/Left: Left Pain - part of body: Hip     Time: [id number] PT Time Calculation (min) (ACUTE ONLY): 26 min  Charges:  $Gait Training: 8-22 mins $Therapeutic Exercise: 8-22 mins                    G Codes:      Tina JarredKati Joh Mann, PT, DPT 07/19/2017 Pager: [phone] [name],[name] 07/19/2017, 3:32 PM

## 2017-07-19 NOTE — Progress Notes (Signed)
   07/19/17 1000  OT Visit Information  Last OT Received On 07/19/17  Assistance Needed +1  History of Present Illness DA THA on LEFT.  Precautions  Precautions Fall  Pain Assessment  Pain Score 5  Pain Location left hip and thigh  Pain Descriptors / Indicators Discomfort;Grimacing;Guarding  Pain Intervention(s) Limited activity within patient's tolerance;Monitored during session;Premedicated before session;Patient requesting pain meds-RN notified  Cognition  Arousal/Alertness Awake/alert  Behavior During Therapy WFL for tasks assessed/performed  Overall Cognitive Status Impaired/Different from baseline  Area of Impairment Memory;Attention  General Comments This am, pt was a little distracted but got herself back to task. Worse this later am session.  Needed cues to continue with task. Reviewed tub transfer twice. We do not have a tub and this was simulated; pt having difficulty generalizing information. Will try to stop back and talk to son prior to d/c  ADL  Lower Body Dressing Moderate assistance;Sit to/from stand;With adaptive equipment (pants only with reacher)  StatisticianToilet Transfer Min guard;Comfort height toilet;RW  Toileting- ArchitectClothing Manipulation and Hygiene Min guard;Sit to/from stand  General ADL Comments extra time for all activities. Pt focused on how much worse pain is today  Bed Mobility  Supine to sit Min guard  General bed mobility comments extra time; used leg lifter  Restrictions  Weight Bearing Restrictions No  Transfers  Equipment used Rolling walker (2 wheeled)  Sit to Stand Min guard  General transfer comment extra time; cues to scoot forward and for UE/LE placement  OT - End of Session  Activity Tolerance Patient limited by pain  Patient left in chair;with call bell/phone within reach  OT Assessment/Plan  OT Visit Diagnosis Pain  Pain - Right/Left Left  Pain - part of body Hip  Follow Up Recommendations Supervision/Assistance - 24 hour  OT Equipment 3 in 1  bedside commode  AM-PAC OT "6 Clicks" Daily Activity Outcome Measure  Help from another person eating meals? 4  Help from another person taking care of personal grooming? 3  Help from another person toileting, which includes using toliet, bedpan, or urinal? 3  Help from another person bathing (including washing, rinsing, drying)? 3  Help from another person to put on and taking off regular upper body clothing? 3  Help from another person to put on and taking off regular lower body clothing? 2  6 Click Score 18  ADL G Code Conversion CK  OT Goal Progression  Progress towards OT goals Progressing toward goals  OT Time Calculation  OT Start Time (ACUTE ONLY) 0928  OT Stop Time (ACUTE ONLY) 1014  OT Time Calculation (min) 46 min  OT General Charges  $OT Visit 1 Visit  OT Treatments  $Self Care/Home Management  38-52 mins  Marica OtterMaryellen Demetri Kerman, OTR/L [phone] 07/19/2017

## 2017-07-19 NOTE — Evaluation (Signed)
Occupational Therapy Evaluation Patient Details Name: [name] MRN: [id number] DOB: 22-Sep-1950 Today's Date: 07/19/2017    History of Present Illness DA THA on LEFT.   Clinical Impression   This 67 year old female was admitted for the above sx. She will benefit from one more session of OT prior to d/c.  Pt ambulated to bathroom and completed toileting and grooming.  Pain was limiting her.  Goals are for supervision level in acute setting. She wants to be as independent as possible. Son will be assisting her at home    Follow Up Recommendations  Supervision/Assistance - 24 hour    Equipment Recommendations  3 in 1 bedside commode(delivered)    Recommendations for Other Services       Precautions / Restrictions Precautions Precautions: Fall Restrictions Weight Bearing Restrictions: No      Mobility Bed Mobility               General bed mobility comments: oob  Transfers   Equipment used: Rolling walker (2 wheeled)   Sit to Stand: Min guard         General transfer comment: extra time; cues to scoot forward and for UE/LE placement    Balance                                           ADL either performed or assessed with clinical judgement   ADL Overall ADL's : Needs assistance/impaired     Grooming: Wash/dry hands;Oral care;Standing;Supervision/safety       Lower Body Bathing: Minimal assistance;Sit to/from stand       Lower Body Dressing: Maximal assistance;Sit to/from stand   Toilet Transfer: Min guard;Ambulation;BSC;RW   Toileting- ArchitectClothing Manipulation and Hygiene: Min guard;Sit to/from stand         General ADL Comments: pt is able to perform UB adls with set up. Son will be staying with her. She will ask a friend to assist with showering.  She also has wipes to use inbetween showers.  Pt has a long sponge and a reacher, but she states she is not good with the reacher     Vision         Perception      Praxis      Pertinent Vitals/Pain Pain Score: 5  Pain Location: left hip and thigh Pain Descriptors / Indicators: Discomfort;Grimacing;Guarding Pain Intervention(s): Limited activity within patient's tolerance;Monitored during session;Repositioned;Ice applied;RN gave pain meds during session     Hand Dominance     Extremity/Trunk Assessment Upper Extremity Assessment Upper Extremity Assessment: Overall WFL for tasks assessed           Communication Communication Communication: No difficulties   Cognition Arousal/Alertness: Awake/alert Behavior During Therapy: WFL for tasks assessed/performed Overall Cognitive Status: Within Functional Limits for tasks assessed                                     General Comments       Exercises     Shoulder Instructions      Home Living Family/patient expects to be discharged to:: Private residence Living Arrangements: Children Available Help at Discharge: Family               Bathroom Shower/Tub: Engineer, civil (consulting)Tub/shower unit   Bathroom Toilet: Handicapped height  Home Equipment: Walker - 2 wheels;Cane - single point   Additional Comments: pt reports a 3;1 was taken home      Prior Functioning/Environment Level of Independence: Independent with assistive device(s)        Comments: straight cane        OT Problem List: Decreased strength;Decreased activity tolerance;Decreased knowledge of use of DME or AE;Pain      OT Treatment/Interventions: Self-care/ADL training;DME and/or AE instruction;Patient/family education;Balance training    OT Goals(Current goals can be found in the care plan section) Acute Rehab OT Goals Patient Stated Goal: to walk without bending over OT Goal Formulation: With patient Time For Goal Achievement: 08/02/17 Potential to Achieve Goals: Good ADL Goals Pt Will Perform Lower Body Bathing: with supervision;with adaptive equipment;sit to/from stand Pt Will Perform Lower Body  Dressing: with supervision;with adaptive equipment;sit to/from stand(pants and underwear) Pt Will Transfer to Toilet: with supervision;ambulating;bedside commode Additional ADL Goal #1: pt will verbalize tub readiness, DME options  OT Frequency: Min 2X/week   Barriers to D/C:            Co-evaluation              AM-PAC PT "6 Clicks" Daily Activity     Outcome Measure Help from another person eating meals?: None Help from another person taking care of personal grooming?: A Little Help from another person toileting, which includes using toliet, bedpan, or urinal?: A Little Help from another person bathing (including washing, rinsing, drying)?: A Little Help from another person to put on and taking off regular upper body clothing?: A Little Help from another person to put on and taking off regular lower body clothing?: A Lot 6 Click Score: 18   End of Session    Activity Tolerance: Patient tolerated treatment well Patient left: in bed;with call bell/phone within reach;with nursing/sitter in room(EOB to eat)  OT Visit Diagnosis: Pain Pain - Right/Left: Left Pain - part of body: Hip                Time: 1610-9604 OT Time Calculation (min): 22 min Charges:  OT General Charges $OT Visit: 1 Visit OT Evaluation $OT Eval Low Complexity: 1 Low G-Codes:     Marica Otter, OTR/L [phone] 07/19/2017  [name] 07/19/2017, 8:41 AM

## 2017-07-19 NOTE — Progress Notes (Signed)
   Subjective: 2 Days Post-Op Procedure(s) (LRB): LEFT TOTAL HIP ARTHROPLASTY ANTERIOR APPROACH (Left) Patient reports pain as mild.   Plan is to go Home after hospital stay.  Objective: Vital signs in last 24 hours: Temp:  [98 F (36.7 C)-98.6 F (37 C)] 98.1 F (36.7 C) (03/22 0616) Pulse Rate:  [68-79] 75 (03/22 0616) Resp:  [16-20] 19 (03/22 0616) BP: (140-189)/(59-82) 140/59 (03/22 0616) SpO2:  [99 %-100 %] 100 % (03/22 0616)  Intake/Output from previous day:  Intake/Output Summary (Last 24 hours) at 07/19/2017 0728 Last data filed at 07/19/2017 0600 Mann per 24 hour  Intake 1387.67 ml  Output 1400 ml  Net -12.33 ml    Intake/Output this shift: No intake/output data recorded.  Labs: Recent Labs    07/18/17 0603 07/19/17 0531  HGB 10.3* 9.5*   Recent Labs    07/18/17 0603 07/19/17 0531  WBC 7.3 6.6  RBC 3.63* 3.30*  HCT 32.8* 29.6*  PLT 176 142*   Recent Labs    07/18/17 1432 07/19/17 0531  NA 138 135  K 4.1 3.6  CL 106 102  CO2 22 22  BUN 19 20  CREATININE 0.94 0.73  GLUCOSE 208* 100*  CALCIUM 8.6* 9.0   No results for input(s): LABPT, INR in the last 72 hours.  EXAM General - Patient is Alert, Appropriate and Oriented Extremity - Neurologically intact Neurovascular intact Incision: dressing C/D/I No cellulitis present Compartment soft Dressing/Incision - clean, dry, no drainage Motor Function - intact, moving foot and toes well on exam.   Past Medical History:  Diagnosis Date  . Acid reflux   . Anemia   . Anxiety   . Avascular necrosis of bone of hip, left (HCC)   . Complication of anesthesia    hard time being put to sleep, has awakened a few times during endoscopy  . Depression   . Elevated liver enzymes   . Headache   . High cholesterol   . History of blood transfusion   . History of gallstones   . History of hypokalemia   . History of measles    childhood  . History of mumps as a child   . History of stomach ulcers   .  Hypertension   . Hyponatremia    History of  . Macular degeneration   . OA (osteoarthritis)   . Osteoporosis   . Seizures (HCC)   . Stomach ulcer     Assessment/Plan: 2 Days Post-Op Procedure(s) (LRB): LEFT TOTAL HIP ARTHROPLASTY ANTERIOR APPROACH (Left) Principal Problem:   OA (osteoarthritis) of hip   Up with therapy Discharge home with home health  DVT Prophylaxis - Xarelto Weight Bearing As Tolerated left Leg  Tina GrossFrank Sachiko Mann 07/19/2017, 7:28 AM

## 2017-07-19 NOTE — Progress Notes (Signed)
OT Note: Touched base with pt's son and simulated tub transfer/talked about readiness.  Also educated that he needs to accompany her to bathroom for transfers. She plans to wear a housecoat for increased privacy. He verbalized comfort with assisting her.  No further questions for OT.  Signing off.  [place] Day[place] Azbill Dillin Lofgren, OTR/L [phone] 07/19/2017

## 2017-07-19 NOTE — Progress Notes (Signed)
Physical Therapy Treatment Patient Details Name: [name] MRN: [id number] DOB: [birth year] Today's Date: 07/19/2017    History of Present Illness Pt is a 67 year old female s/p Left DA THA    PT Comments    Pt ambulated short distance and practiced one step.  Pt's son not in room at the time.  Will return to review safe step technique with son and also go over exercises with pt.  Follow Up Recommendations  Home health PT     Equipment Recommendations  None recommended by PT    Recommendations for Other Services       Precautions / Restrictions Precautions Precautions: Fall Restrictions Weight Bearing Restrictions: No    Mobility  Bed Mobility         Supine to sit: Min guard     General bed mobility comments: pt up in recliner, had been working with OT  Transfers Overall transfer level: Needs assistance Equipment used: Rolling walker (2 wheeled) Transfers: Sit to/from Stand Sit to Stand: Min guard         General transfer comment: increased time, verbal cues for technique  Ambulation/Gait Ambulation/Gait assistance: Min guard Ambulation Distance (Feet): 30 Feet Assistive device: Rolling walker (2 wheeled) Gait Pattern/deviations: Step-to pattern;Antalgic     General Gait Details: verbal cues for sequence, step length, posture, distance limited due to pain   Stairs Stairs: Yes   Stair Management: Step to pattern;Backwards;With walker Number of Stairs: 1 General stair comments: verbal cues for safety and sequence, performed twice  Wheelchair Mobility    Modified Rankin (Stroke Patients Only)       Balance                                            Cognition Arousal/Alertness: Awake/alert Behavior During Therapy: WFL for tasks assessed/performed;Anxious Overall Cognitive Status: Within Functional Limits for tasks assessed Area of Impairment: Memory;Attention                               General  Comments: This am, pt was a little distracted but got herself back to task. Worse this later am session.  Needed cues to continue with task. Reviewed tub transfer twice. We do not have a tub and this was simulated; pt having difficulty generalizing information. Will try to stop back and talk to son prior to d/c      Exercises      General Comments        Pertinent Vitals/Pain Pain Assessment: 0-10 Pain Score: 5  Pain Location: left hip and thigh Pain Descriptors / Indicators: Grimacing;Guarding;Sore Pain Intervention(s): Repositioned;Limited activity within patient's tolerance;Monitored during session    Home Living                      Prior Function            PT Goals (current goals can now be found in the care plan section) Progress towards PT goals: Progressing toward goals    Frequency    7X/week      PT Plan Current plan remains appropriate    Co-evaluation              AM-PAC PT "6 Clicks" Daily Activity  Outcome Measure  Difficulty turning over in bed (including adjusting bedclothes, sheets and  blankets)?: A Little Difficulty moving from lying on back to sitting on the side of the bed? : A Little Difficulty sitting down on and standing up from a chair with arms (e.g., wheelchair, bedside commode, etc,.)?: A Little Help needed moving to and from a bed to chair (including a wheelchair)?: A Little Help needed walking in hospital room?: A Little Help needed climbing 3-5 steps with a railing? : A Little 6 Click Score: 18    End of Session Equipment Utilized During Treatment: Gait belt Activity Tolerance: Patient limited by pain Patient left: with call bell/phone within reach;in chair Nurse Communication: Mobility status PT Visit Diagnosis: Pain;Difficulty in walking, not elsewhere classified (R26.2) Pain - Right/Left: Left Pain - part of body: Hip     Time: [id number] PT Time Calculation (min) (ACUTE ONLY): 16 min  Charges:  $Gait  Training: 8-22 mins                    G Codes:       Zenovia JarredKati Addiel Mccardle, PT, DPT 07/19/2017 Pager: [phone]  [name],[name] 07/19/2017, 1:08 PM

## 2019-05-17 IMAGING — DX DG PORTABLE PELVIS
1 series · 1 of 1 positions shown · non-contrast
Comparison: Films from earlier in the same day.

CLINICAL DATA: Status post left hip replacement

EXAM:
PORTABLE PELVIS 1-2 VIEWS

[pelvis ap]
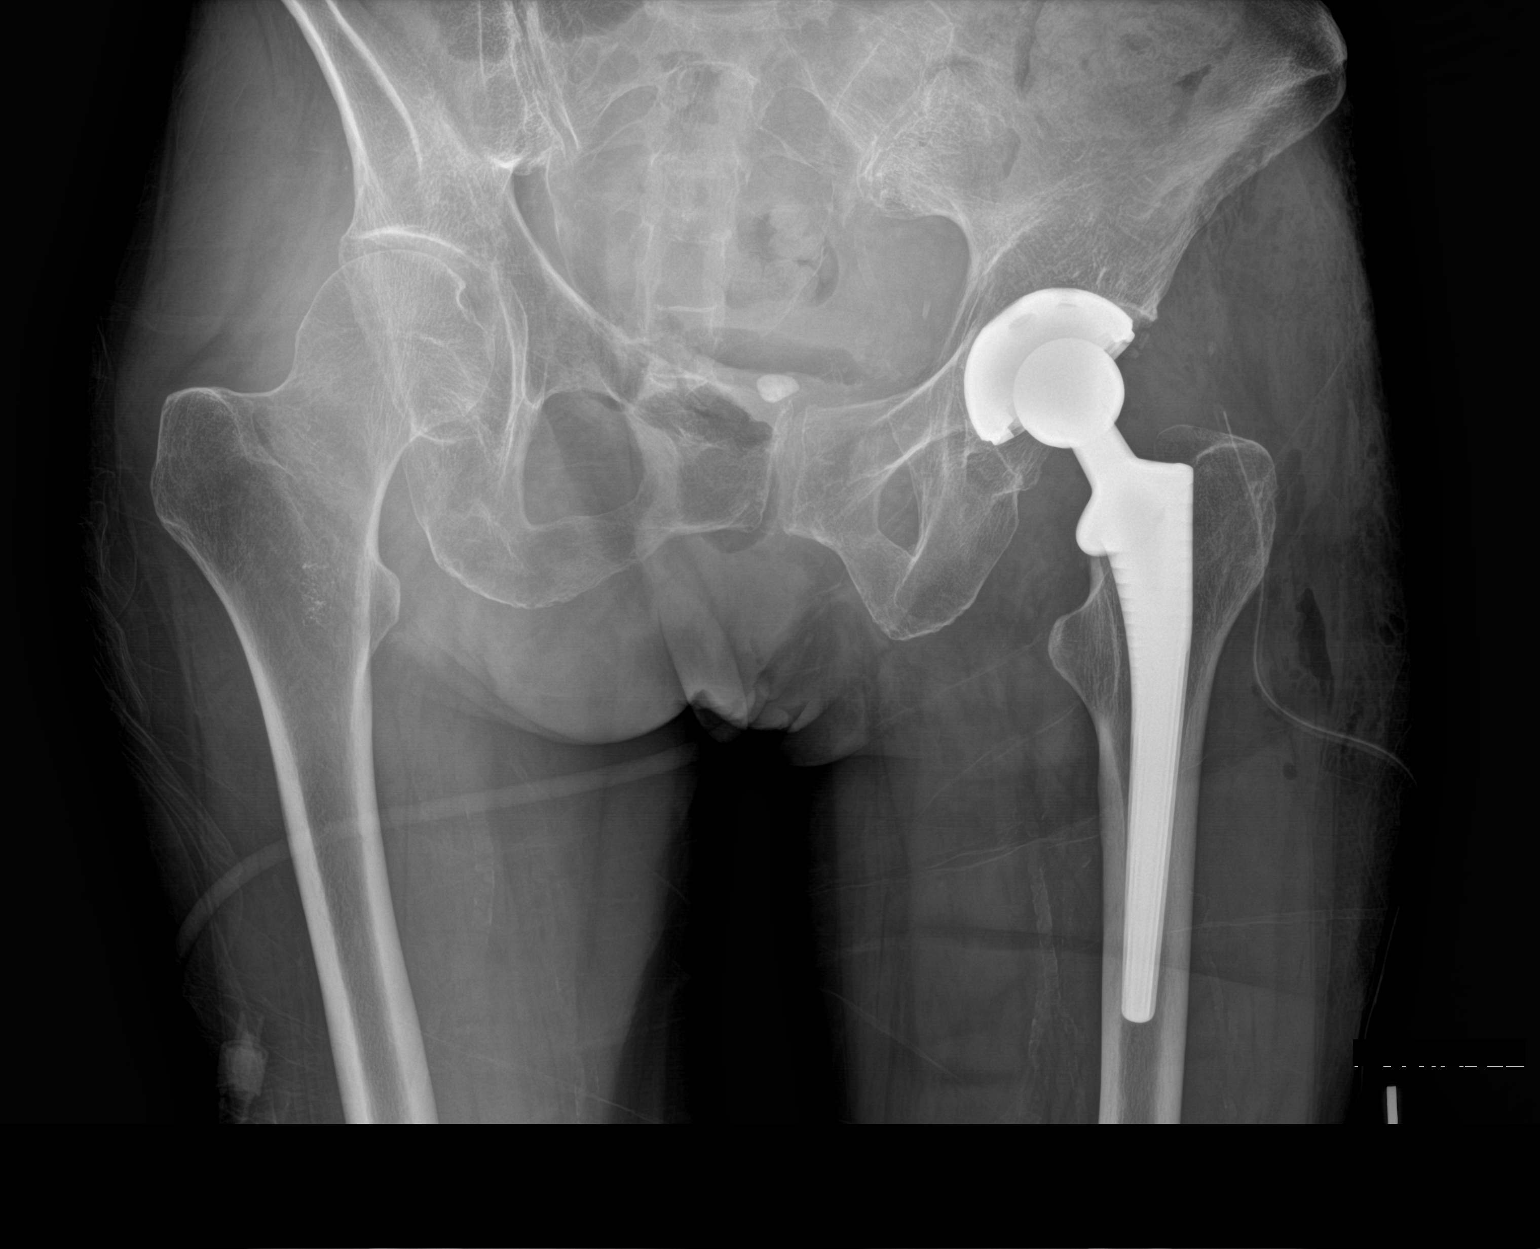

[1 of 1 positions shown; findings below may reference images not displayed]

FINDINGS: Left hip prosthesis is seen. Surgical drain is noted in place. No
acute bony or soft tissue abnormality is noted.
IMPRESSION: Status post left hip replacement.

## 2022-11-29 DEATH — deceased
